# Patient Record
Sex: Male | Born: 2000 | Race: White | Hispanic: No | Marital: Single | Smoking: Current every day smoker
Health system: Southern US, Community
[De-identification: ages and names within clinical notes are randomized; demographics above are authoritative.]

## PROBLEM LIST (undated history)

## (undated) DIAGNOSIS — B001 Herpesviral vesicular dermatitis: Secondary | ICD-10-CM

## (undated) DIAGNOSIS — S060X0A Concussion without loss of consciousness, initial encounter: Secondary | ICD-10-CM

## (undated) HISTORY — DX: Herpesviral vesicular dermatitis: B00.1

## (undated) HISTORY — DX: Concussion without loss of consciousness, initial encounter: S06.0X0A

## (undated) HISTORY — PX: TYMPANOSTOMY TUBE PLACEMENT: SHX32

---
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
## 2018-08-02 ENCOUNTER — Encounter: Payer: Self-pay | Admitting: Physician Assistant

## 2018-08-02 ENCOUNTER — Ambulatory Visit: Payer: PRIVATE HEALTH INSURANCE | Admitting: Physician Assistant

## 2018-08-02 VITALS — BP 130/66 | HR 69 | Temp 98.0°F | Wt 181.0 lb

## 2018-08-02 DIAGNOSIS — B001 Herpesviral vesicular dermatitis: Secondary | ICD-10-CM | POA: Diagnosis not present

## 2018-08-02 DIAGNOSIS — J0140 Acute pansinusitis, unspecified: Secondary | ICD-10-CM

## 2018-08-02 MED ORDER — FLUTICASONE PROPIONATE 50 MCG/ACT NA SUSP: 2.0000 | Freq: Every day | NASAL | 6 refills | Status: DC

## 2018-08-02 MED ORDER — AZITHROMYCIN 250 MG PO TABS: ORAL_TABLET | ORAL | 0 refills | Status: DC

## 2018-08-02 MED ORDER — VALACYCLOVIR HCL 1 G PO TABS: ORAL_TABLET | ORAL | 3 refills | Status: AC

## 2018-08-02 NOTE — Patient Instructions (Signed)

## 2018-08-02 NOTE — Progress Notes (Signed)
   Subjective:    Patient ID: Antonio Garcia, male    DOB: 2000-09-23, 18 y.o.   MRN: [id number]  HPI  Patient is a 18 year old male who presents to the clinic with 2 weeks of sinus pressure, ear pain, cough, URI symptoms.  He denies any shortness of breath or wheezing.  In the last day he has had some right thigh redness, irritation, discomfort and discharge.  He has been taking DayQuil and NyQuil regularly.  It helps some but he cannot seem to get better.  He feels like his hearing has decreased over time.  He had a fever at the beginning but not had one since.  He denies any body aches or chills.  .. Active Ambulatory Problems    Diagnosis Date Noted  . ACUTE LYMPHADENITIS 02/04/2011  . Anxiety state 11/15/2015  . Obsessional thoughts 11/15/2015  . Concussion 07/25/2016   Resolved Ambulatory Problems    Diagnosis Date Noted  . Maxillary sinusitis, acute 09/14/2015   Past Medical History:  Diagnosis Date  . Concussion without loss of consciousness 06/2016  . Recurrent herpes labialis      Review of Systems See HPI.     Objective:   Physical Exam Vitals signs reviewed.  Constitutional:      Appearance: Normal appearance.  HENT:     Head: Normocephalic and atraumatic.     Comments: Tenderness over maxillary sinuses.     Ears:     Comments: TM's with bilateral middle ear effusions.     Nose: Congestion present.     Mouth/Throat:     Pharynx: Posterior oropharyngeal erythema present.  Eyes:     Extraocular Movements: Extraocular movements intact.     Pupils: Pupils are equal, round, and reactive to light.     Comments: Right injected conjunctiva without discharge.   Cardiovascular:     Rate and Rhythm: Normal rate and regular rhythm.  Pulmonary:     Effort: Pulmonary effort is normal.     Breath sounds: Normal breath sounds.  Neurological:     General: No focal deficit present.     Mental Status: He is alert and oriented to person, place, and time.  Psychiatric:         Mood and Affect: Mood normal.           Assessment & Plan:  Marland KitchenMarland KitchenMaximilien was seen today for cough.  Diagnoses and all orders for this visit:  Acute non-recurrent pansinusitis -     azithromycin (ZITHROMAX) 250 MG tablet; Take 2 tablets now and then one tablet for 4 days. -     fluticasone (FLONASE) 50 MCG/ACT nasal spray; Place 2 sprays into both nostrils daily.  Fever blister -     valACYclovir (VALTREX) 1000 MG tablet; TAKE 2 TABLETS BY MOUTH EVERY 12 HOURS FOR 2 DOSES AS NEEDED FOR FEVER BLISTERS   Pt was treated for sinusitis with zpak and flonase. Valtrex for fever blisters. HO given for symptomatic care. Follow up as needed.

## 2018-09-30 ENCOUNTER — Telehealth (INDEPENDENT_AMBULATORY_CARE_PROVIDER_SITE_OTHER): Payer: PRIVATE HEALTH INSURANCE | Admitting: Physician Assistant

## 2018-09-30 ENCOUNTER — Encounter: Payer: Self-pay | Admitting: Physician Assistant

## 2018-09-30 ENCOUNTER — Other Ambulatory Visit: Payer: Self-pay

## 2018-09-30 DIAGNOSIS — J029 Acute pharyngitis, unspecified: Secondary | ICD-10-CM | POA: Diagnosis not present

## 2018-09-30 MED ORDER — PENICILLIN V POTASSIUM 500 MG PO TABS: 500.0000 mg | ORAL_TABLET | Freq: Two times a day (BID) | ORAL | 0 refills | Status: DC

## 2018-09-30 NOTE — Patient Instructions (Signed)
Pharyngitis  Pharyngitis is a sore throat (pharynx). This is when there is redness, pain, and swelling in your throat. Most of the time, this condition gets better on its own. In some cases, you may need medicine. Follow these instructions at home:  Take over-the-counter and prescription medicines only as told by your doctor. ? If you were prescribed an antibiotic medicine, take it as told by your doctor. Do not stop taking the antibiotic even if you start to feel better. ? Do not give children aspirin. Aspirin has been linked to Reye syndrome.  Drink enough water and fluids to keep your pee (urine) clear or pale yellow.  Get a lot of rest.  Rinse your mouth (gargle) with a salt-water mixture 3-4 times a day or as needed. To make a salt-water mixture, completely dissolve -1 tsp of salt in 1 cup of warm water.  If your doctor approves, you may use throat lozenges or sprays to soothe your throat. Contact a doctor if:  You have large, tender lumps in your neck.  You have a rash.  You cough up green, yellow-brown, or bloody spit. Get help right away if:  You have a stiff neck.  You drool or cannot swallow liquids.  You cannot drink or take medicines without throwing up.  You have very bad pain that does not go away with medicine.  You have problems breathing, and it is not from a stuffy nose.  You have new pain and swelling in your knees, ankles, wrists, or elbows. Summary  Pharyngitis is a sore throat (pharynx). This is when there is redness, pain, and swelling in your throat.  If you were prescribed an antibiotic medicine, take it as told by your doctor. Do not stop taking the antibiotic even if you start to feel better.  Most of the time, pharyngitis gets better on its own. Sometimes, you may need medicine. This information is not intended to replace advice given to you by your health care provider. Make sure you discuss any questions you have with your health care  provider. Document Released: 11/29/2007 Document Revised: 07/18/2016 Document Reviewed: 07/18/2016 Elsevier Interactive Patient Education  2019 [employer].  

## 2018-09-30 NOTE — Progress Notes (Signed)
Virtual Visit via Telephone Note  I connected with Antonio Garcia on 09/30/18 at  2:40 PM EDT by telephone and verified that I am speaking with the correct person using two identifiers.   I discussed the limitations, risks, security and privacy concerns of performing an evaluation and management service by telephone and the availability of in person appointments. I also discussed with the patient that there may be a patient responsible charge related to this service. The patient expressed understanding and agreed to proceed.   History of Present Illness: Reports fever for 4 days (since Friday). Highest fever of 103 yesterday. Associated with myalgias and sore throat.  Denies swelling or exudates on his tonsils, but admits he hasn't really looked in his throat. Endorses tender cervical lymph nodes worse on the right side Denies dysphagia, drooling, voice change Endorses intermittent cough occasionally productive of yellow mucus. Denies chest tightness or shortness of breath Denies any known sick contacts.  Non-smoker  No prior hx of asthma or lung disease   Past Medical History:  Diagnosis Date  . Concussion without loss of consciousness 06/2016  . Recurrent herpes labialis    Past Surgical History:  Procedure Laterality Date  . TYMPANOSTOMY TUBE PLACEMENT Bilateral    Social History   Tobacco Use  . Smoking status: Never Smoker  . Smokeless tobacco: Never Used  Substance Use Topics  . Alcohol use: No   family history includes Anxiety disorder in his father and sister; Colon cancer in an other family member; Heart attack in an other family member; Hyperlipidemia in his father, maternal grandfather, and mother.    ROS: negative except as noted in the HPI  Medications: Current Outpatient Medications  Medication Sig Dispense Refill  . valACYclovir (VALTREX) 1000 MG tablet TAKE 2 TABLETS BY MOUTH EVERY 12 HOURS FOR 2 DOSES AS NEEDED FOR FEVER BLISTERS 20 tablet 3   No  current facility-administered medications for this visit.    No Known Allergies     Objective:  There were no vitals taken for this visit. Pulm: Normal work of breathing, normal phonation, speaking in full sentences   No results found for this or any previous visit (from the past 72 hour(s)). No results found.    Assessment and Plan: 18 y.o. male with   .Diagnoses and all orders for this visit:  Acute pharyngitis, unspecified etiology -     penicillin v potassium (VEETID) 500 MG tablet; Take 1 tablet (500 mg total) by mouth 2 (two) times daily.   Centor score =2, 11-18% chance of GAS Treating empirically for GAS with PCN. Reassess in 48 hours We discussed alternative diagnoses including viral pharyngitis, infectious mono, and COVID-19 Recommended he take precautions and assume he is contagious. Isolate in room, avoid direct contact with household members, wash hands frequently and disinfect shared surfaces  Follow Up Instructions:    I discussed the assessment and treatment plan with the patient. The patient was provided an opportunity to ask questions and all were answered. The patient agreed with the plan and demonstrated an understanding of the instructions.   The patient was advised to call back or seek an in-person evaluation if the symptoms worsen or if the condition fails to improve as anticipated.  I provided 11-20 minutes of non-face-to-face time during this encounter.   Carlis Stable, [place]

## 2018-10-02 ENCOUNTER — Ambulatory Visit (INDEPENDENT_AMBULATORY_CARE_PROVIDER_SITE_OTHER): Payer: PRIVATE HEALTH INSURANCE | Admitting: Physician Assistant

## 2018-10-02 ENCOUNTER — Encounter: Payer: Self-pay | Admitting: Physician Assistant

## 2018-10-02 VITALS — Temp 98.0°F

## 2018-10-02 DIAGNOSIS — J029 Acute pharyngitis, unspecified: Secondary | ICD-10-CM

## 2018-10-02 NOTE — Progress Notes (Signed)
Virtual Visit via Telephone Note  I connected with Antonio Garcia on 10/02/18 at  1:20 PM EDT by telephone and verified that I am speaking with the correct person using two identifiers.   I discussed the limitations, risks, security and privacy concerns of performing an evaluation and management service by telephone and the availability of in person appointments. I also discussed with the patient that there may be a patient responsible charge related to this service. The patient expressed understanding and agreed to proceed.   History of Present Illness: 2 day follow-up for pharyngitis Antonio Garcia presented for an e-visit on Monday 09/30/18 with 4 days of fever and sore throat. He was treated empirically for GAS pharyngitis with PCN. He is on day 3 of antibiotics currently. Has not had a fever since Monday, sore throat has improved, but is still present. Has noticed white patches on both tonsils and still has cervical adenopathy. Reports he is having headaches that improve with OTC analgesics. Denies cough or SOB.    Past Medical History:  Diagnosis Date  . Concussion without loss of consciousness 06/2016  . Recurrent herpes labialis    Past Surgical History:  Procedure Laterality Date  . TYMPANOSTOMY TUBE PLACEMENT Bilateral    Social History   Tobacco Use  . Smoking status: Never Smoker  . Smokeless tobacco: Never Used  Substance Use Topics  . Alcohol use: No   family history includes Anxiety disorder in his father and sister; Colon cancer in an other family member; Heart attack in an other family member; Hyperlipidemia in his father, maternal grandfather, and mother.    ROS: negative except as noted in the HPI  Medications: Current Outpatient Medications  Medication Sig Dispense Refill  . penicillin v potassium (VEETID) 500 MG tablet Take 1 tablet (500 mg total) by mouth 2 (two) times daily. 20 tablet 0  . valACYclovir (VALTREX) 1000 MG tablet TAKE 2 TABLETS BY MOUTH EVERY 12  HOURS FOR 2 DOSES AS NEEDED FOR FEVER BLISTERS 20 tablet 3   No current facility-administered medications for this visit.    No Known Allergies     Objective:  Temp 98 F (36.7 C) (Oral)  Pulm: Normal work of breathing, normal phonation, speaking in full sentences Neuro: alert and oriented x 3   No results found for this or any previous visit (from the past 72 hour(s)). No results found.    Assessment and Plan: 18 y.o. male with   .Diagnoses and all orders for this visit:  Acute pharyngitis, unspecified etiology  Clinically improving with antibiotic therapy. Viral pharyngitis is still on the differential, including infectious mono Complete full 7 day course of antibiotic Return precautions discussed Work note provided for remainder of the week  Follow Up Instructions:    I discussed the assessment and treatment plan with the patient. The patient was provided an opportunity to ask questions and all were answered. The patient agreed with the plan and demonstrated an understanding of the instructions.   The patient was advised to call back or seek an in-person evaluation if the symptoms worsen or if the condition fails to improve as anticipated.  I provided 5-10 minutes of non-face-to-face time during this encounter.   Carlis Stable, [place]

## 2018-10-02 NOTE — Patient Instructions (Signed)

## 2018-12-18 ENCOUNTER — Encounter: Payer: Self-pay | Admitting: Family Medicine

## 2018-12-18 ENCOUNTER — Telehealth: Payer: Self-pay

## 2018-12-18 ENCOUNTER — Other Ambulatory Visit: Payer: Self-pay

## 2018-12-18 ENCOUNTER — Ambulatory Visit (INDEPENDENT_AMBULATORY_CARE_PROVIDER_SITE_OTHER): Payer: PRIVATE HEALTH INSURANCE | Admitting: Family Medicine

## 2018-12-18 DIAGNOSIS — G43809 Other migraine, not intractable, without status migrainosus: Secondary | ICD-10-CM

## 2018-12-18 NOTE — Telephone Encounter (Signed)
Opened in error

## 2018-12-18 NOTE — Progress Notes (Signed)
Pt did not obtain any vitals.

## 2018-12-18 NOTE — Patient Instructions (Signed)
Thank you for coming in today.    

## 2018-12-18 NOTE — Progress Notes (Signed)
Virtual Visit  via Video Note  I connected with      Antonio Garcia Demetrius by a video enabled telemedicine application and verified that I am speaking with the correct person using two identifiers.   I discussed the limitations of evaluation and management by telemedicine and the availability of in person appointments. The patient expressed understanding and agreed to proceed.  History of Present Illness: Antonio Garcia Bilotti is a 18 y.o. male who would like to discuss migraine. Monday awoke with migraine and missed work. Feeling better now after taking Tylenol Monday and yesterday.  He feels as though he is ready to return to work but needs a return to work note.  He notes a get migraines a few times a year.  He gets less than 2 migraines per month.  He feels well otherwise with no fevers chills nausea vomiting or diarrhea.  No cough body aches headache.   Observations/Objective: There were no vitals taken for this visit. Wt Readings from Last 5 Encounters:  08/02/18 181 lb (82.1 kg) (81 %, Z= 0.88)*  12/19/16 169 lb 9.6 oz (76.9 kg) (76 %, Z= 0.72)*  12/15/16 167 lb (75.8 kg) (74 %, Z= 0.64)*  11/02/16 169 lb 4 oz (76.8 kg) (77 %, Z= 0.73)*  08/08/16 168 lb (76.2 kg) (77 %, Z= 0.73)*   * Growth percentiles are based on CDC (Boys, 2-20 Years) data.   Exam: Appearance nontoxic no acute distress Normal Speech.  Neuro: Moves all extremities.  Normal coordination.  Normal facial motion.  Lab and Radiology Results No results found for this or any previous visit (from the past 72 hour(s)). No results found.   Assessment and Plan: 18 y.o. male with migraine: Much improved now.  Discussed prevention prophylactic medication.  Patient does not meet criteria for prophylactic treatment at this time.  Additionally discussed abortive medications such as Imitrex.  Patient declines. Okay to return to work.  Note provided.  Recheck with me in near future for physical.   PDMP not reviewed this  encounter. No orders of the defined types were placed in this encounter.  No orders of the defined types were placed in this encounter.   Follow Up Instructions:    I discussed the assessment and treatment plan with the patient. The patient was provided an opportunity to ask questions and all were answered. The patient agreed with the plan and demonstrated an understanding of the instructions.   The patient was advised to call back or seek an in-person evaluation if the symptoms worsen or if the condition fails to improve as anticipated.  Time: 15 minutes of intraservice time, with >22 minutes of total time during today's visit.      Historical information moved to improve visibility of documentation.  Past Medical History:  Diagnosis Date  . Concussion without loss of consciousness 06/2016  . Recurrent herpes labialis    Past Surgical History:  Procedure Laterality Date  . TYMPANOSTOMY TUBE PLACEMENT Bilateral    Social History   Tobacco Use  . Smoking status: Never Smoker  . Smokeless tobacco: Never Used  Substance Use Topics  . Alcohol use: No   family history includes Anxiety disorder in his father and sister; Colon cancer in an other family member; Heart attack in an other family member; Hyperlipidemia in his father, maternal grandfather, and mother.  Medications: Current Outpatient Medications  Medication Sig Dispense Refill  . valACYclovir (VALTREX) 1000 MG tablet TAKE 2 TABLETS BY MOUTH EVERY 12 HOURS FOR  2 DOSES AS NEEDED FOR FEVER BLISTERS 20 tablet 3   No current facility-administered medications for this visit.    No Known Allergies

## 2019-03-20 ENCOUNTER — Other Ambulatory Visit: Payer: Self-pay

## 2019-03-20 ENCOUNTER — Encounter: Payer: Self-pay | Admitting: Family Medicine

## 2019-03-20 ENCOUNTER — Ambulatory Visit (INDEPENDENT_AMBULATORY_CARE_PROVIDER_SITE_OTHER): Payer: PRIVATE HEALTH INSURANCE | Admitting: Family Medicine

## 2019-03-20 ENCOUNTER — Ambulatory Visit (INDEPENDENT_AMBULATORY_CARE_PROVIDER_SITE_OTHER): Payer: PRIVATE HEALTH INSURANCE

## 2019-03-20 ENCOUNTER — Other Ambulatory Visit: Payer: Self-pay | Admitting: Family Medicine

## 2019-03-20 VITALS — BP 130/79 | HR 83 | Temp 98.2°F | Wt 177.0 lb

## 2019-03-20 DIAGNOSIS — R0789 Other chest pain: Secondary | ICD-10-CM | POA: Diagnosis not present

## 2019-03-20 DIAGNOSIS — K5900 Constipation, unspecified: Secondary | ICD-10-CM

## 2019-03-20 DIAGNOSIS — R1013 Epigastric pain: Secondary | ICD-10-CM

## 2019-03-20 MED ORDER — SUCRALFATE 1 G PO TABS: 1.0000 g | ORAL_TABLET | Freq: Four times a day (QID) | ORAL | 0 refills | Status: DC

## 2019-03-20 MED ORDER — POLYETHYLENE GLYCOL 3350 17 GM/SCOOP PO POWD: 17.0000 g | Freq: Every day | ORAL | 1 refills | Status: DC

## 2019-03-20 MED ORDER — OMEPRAZOLE 40 MG PO CPDR: 40.0000 mg | DELAYED_RELEASE_CAPSULE | Freq: Every day | ORAL | 3 refills | Status: DC

## 2019-03-20 NOTE — Patient Instructions (Addendum)
Thank you for coming in today. Get x-ray and labs today. Take MiraLAX 1 capful 1-2 times per day to achieve soft bowel movement daily.  Reduce dose if needed. Take omeprazole daily for 1 month to reduce stomach acid. Take Carafate 4 times daily for about 1 week to coat and lie in the stomach. You should hear soon about stress test.  Let me know in 1 week if you have not heard anything about scheduling. You should also hear soon about echocardiogram.  Let me know if you do not hear anything.  Recheck in 1 month.   Nonspecific Chest Pain Chest pain can be caused by many different conditions. Some causes of chest pain can be life-threatening. These will require treatment right away. Serious causes of chest pain include:  Heart attack.  A tear in the body's main blood vessel.  Redness and swelling (inflammation) around your heart.  Blood clot in your lungs. Other causes of chest pain may not be so serious. These include:  Heartburn.  Anxiety or stress.  Damage to bones or muscles in your chest.  Lung infections. Chest pain can feel like:  Pain or discomfort in your chest.  Crushing, pressure, aching, or squeezing pain.  Burning or tingling.  Dull or sharp pain that is worse when you move, cough, or take a deep breath.  Pain or discomfort that is also felt in your back, neck, jaw, shoulder, or arm, or pain that spreads to any of these areas. It is hard to know whether your pain is caused by something that is serious or something that is not so serious. So it is important to see your doctor right away if you have chest pain. Follow these instructions at home: Medicines  Take over-the-counter and prescription medicines only as told by your doctor.  If you were prescribed an antibiotic medicine, take it as told by your doctor. Do not stop taking the antibiotic even if you start to feel better. Lifestyle   Rest as told by your doctor.  Do not use any products that contain  nicotine or tobacco, such as cigarettes, e-cigarettes, and chewing tobacco. If you need help quitting, ask your doctor.  Do not drink alcohol.  Make lifestyle changes as told by your doctor. These may include: ? Getting regular exercise. Ask your doctor what activities are safe for you. ? Eating a heart-healthy diet. A diet and nutrition specialist (dietitian) can help you to learn healthy eating options. ? Staying at a healthy weight. ? Treating diabetes or high blood pressure, if needed. ? Lowering your stress. Activities such as yoga and relaxation techniques can help. General instructions  Pay attention to any changes in your symptoms. Tell your doctor about them or any new symptoms.  Avoid any activities that cause chest pain.  Keep all follow-up visits as told by your doctor. This is important. You may need more testing if your chest pain does not go away. Contact a doctor if:  Your chest pain does not go away.  You feel depressed.  You have a fever. Get help right away if:  Your chest pain is worse.  You have a cough that gets worse, or you cough up blood.  You have very bad (severe) pain in your belly (abdomen).  You pass out (faint).  You have either of these for no clear reason: ? Sudden chest discomfort. ? Sudden discomfort in your arms, back, neck, or jaw.  You have shortness of breath at any time.  You suddenly start to sweat, or your skin gets clammy.  You feel sick to your stomach (nauseous).  You throw up (vomit).  You suddenly feel lightheaded or dizzy.  You feel very weak or tired.  Your heart starts to beat fast, or it feels like it is skipping beats. These symptoms may be an emergency. Do not wait to see if the symptoms will go away. Get medical help right away. Call your local emergency services (911 in the [place] Do not drive yourself to the hospital. Summary  Chest pain can be caused by many different conditions. The cause may be serious  and need treatment right away. If you have chest pain, see your doctor right away.  Follow your doctor's instructions for taking medicines and making lifestyle changes.  Keep all follow-up visits as told by your doctor. This includes visits for any further testing if your chest pain does not go away.  Be sure to know the signs that show that your condition has become worse. Get help right away if you have these symptoms. This information is not intended to replace advice given to you by your health care provider. Make sure you discuss any questions you have with your health care provider. Document Released: 11/29/2007 Document Revised: 12/13/2017 Document Reviewed: 12/13/2017 Elsevier Patient Education  2020 [employer].

## 2019-03-20 NOTE — Progress Notes (Signed)
Antonio DickerBrendan Mervine is a 18 y.o. male who [facility] today for ongoing chest pain, heartburn, and constipation.    Chest pain  - Patient was seen in ED on 01/30/19 for sudden onset sharp chest pain. ED evaluation concluded that pain was likely due to GERD. Patient continues to have dull-like pain in chest that comes and goes throughout the day. The pain has not resolved since presenting to the ED. The pain sometimes wakes him up in the middle of the night. He sometimes has episodes where it feels like his heart is racing.  The pain used to be tender in his chest and between his shoulder blades but the tenderness has resolved. He has tried ibuprofen, IcyHot, icing the area, tennis ball rolling with no relief. He is constantly worrying about the pain and was not satisfied with ED's evaluation. He denies SOB, pain on inspiration, fever, chills, cough, recent injury or trauma.   Heartburn/Constipation - Patient took 6-8 ibuprofen at once on Saturday to relieve his chest pain. Soon after, he began to have heartburn. He took a "acid reducer" a couple of times but the heartburn has been persistent.  - Since Saturday after he took the ibuprofen, patient has been constipated. He took Miralax on Monday and has been eating food with fiber and fruits, avoiding soda, and drinking water. This provided some relief and he was able to have a few small BM but reports straining. He also reports persistent diffuse mild pain in his epigastric region and difficulty passing gas.   ROS as above  Exam:  BP 130/79   Pulse 83   Temp 98.2 F (36.8 C) (Oral)   Wt 177 lb (80.3 kg)   BMI 20.45 kg/m  Wt Readings from Last 5 Encounters:  03/20/19 177 lb (80.3 kg)  08/02/18 181 lb (82.1 kg) (81 %, Z= 0.88)*  12/19/16 169 lb 9.6 oz (76.9 kg) (76 %, Z= 0.72)*  12/15/16 167 lb (75.8 kg) (74 %, Z=  0.64)*  11/02/16 169 lb 4 oz (76.8 kg) (77 %, Z= 0.73)*   * Growth percentiles are based on CDC (Boys, 2-20 Years) data.    Gen: Well NAD. Non-toxic appearing.  Lungs: Normal work of breathing. CTABL. No wheezes, rales, or rhonchi. Heart: RRR no MRG.  Abd: NABS, Soft. Nondistended, Nontender.  Exts: Brisk capillary refill, warm and well perfused.  MSK: No TTP of chest wall or paraspinal region.   Lab and Radiology Results  12-Lead EKG Normal sinus rhythm with a rate of 74 bpm.  No ST segment elevation or depression.  Possible left axis deviation.  T waves slightly peaked but not diagnostic for typical peaked T waves..  - PR interval - 160 ms  - QRS duration: 106 ms - QT/QTc - 356/413  Assessment and Plan: 18 y.o. male with a two month history of chest pain and 1 week history of heartburn and constipation. EKG showed evidence of peaked T waves and possible left ventricular hypertrophy. Will further evaluate cardiac function with echocardiogram and stress test. Will order CXR, lipase troponin, CMP, TSH to further evaluate chest pain. Will also perform H.pylori breath test to evaluate epigastric pain and start omeprazole and Carafate for management of heartburn. Will start Miralax daily for constipation.   PDMP not reviewed this encounter. Orders Placed This Encounter  Procedures  . DG Abd Acute W/Chest    Standing Status:   Future  Number of Occurrences:   1    Standing Expiration Date:   05/19/2020    Order Specific Question:   Reason for Exam (SYMPTOM  OR DIAGNOSIS REQUIRED)    Answer:   eval left chest and left upper quadrent abd    Order Specific Question:   Preferred imaging location?    Answer:   [facility]    Order Specific Question:   Radiology Contrast Protocol - do NOT remove file path    Answer:   [email or web address]  . Troponin I  . Lipase  . COMPLETE METABOLIC PANEL WITH GFR  . TSH  . H. pylori breath test  .  EXERCISE TOLERANCE TEST (ETT)    Standing Status:   Future    Standing Expiration Date:   06/18/2020    Order Specific Question:   Where should this test be performed    Answer:   [facility]    Order Specific Question:   Stress with Dobutamine or Treadmill with exercise?    Answer:   Treadmill w/ exercise    Order Specific Question:   Is patient able to ambulate on a treadmill?    Answer:   Yes  . EKG 12-Lead  . ECHOCARDIOGRAM COMPLETE    Standing Status:   Future    Standing Expiration Date:   06/18/2020    Order Specific Question:   Where should this test be performed    Answer:   [facility]    Order Specific Question:   Perflutren DEFINITY (image enhancing agent) should be administered unless hypersensitivity or allergy exist    Answer:   Administer Perflutren    Order Specific Question:   Reason for exam-Echo    Answer:   Chest Pain  786.50 / R07.9   Meds ordered this encounter  Medications  . omeprazole (PRILOSEC) 40 MG capsule    Sig: Take 1 capsule (40 mg total) by mouth daily.    Dispense:  30 capsule    Refill:  3  . sucralfate (CARAFATE) 1 g tablet    Sig: Take 1 tablet (1 g total) by mouth 4 (four) times daily.    Dispense:  120 tablet    Refill:  0  . polyethylene glycol powder (GLYCOLAX/MIRALAX) 17 GM/SCOOP powder    Sig: Take 17 g by mouth daily.    Dispense:  850 g    Refill:  1     Historical information moved to improve visibility of documentation.  Past Medical History:  Diagnosis Date  . Concussion without loss of consciousness 06/2016  . Recurrent herpes labialis    Past Surgical History:  Procedure Laterality Date  . TYMPANOSTOMY TUBE PLACEMENT Bilateral    Social History   Tobacco Use  . Smoking status: Never Smoker  . Smokeless tobacco: Never Used  Substance Use Topics  . Alcohol use: No   family history includes Anxiety disorder in his father and sister; Colon cancer in an other family member; Heart  attack in an other family member; Hyperlipidemia in his father, maternal grandfather, and mother.  Medications: Current Outpatient Medications  Medication Sig Dispense Refill  . omeprazole (PRILOSEC) 40 MG capsule Take 1 capsule (40 mg total) by mouth daily. 30 capsule 3  . polyethylene glycol powder (GLYCOLAX/MIRALAX) 17 GM/SCOOP powder Take 17 g by mouth daily. 850 g 1  . sucralfate (CARAFATE) 1 g tablet Take 1 tablet (1 g total) by mouth 4 (four) times daily. 120 tablet 0  . valACYclovir (  VALTREX) 1000 MG tablet TAKE 2 TABLETS BY MOUTH EVERY 12 HOURS FOR 2 DOSES AS NEEDED FOR FEVER BLISTERS 20 tablet 3   No current facility-administered medications for this visit.    No Known Allergies   Discussed warning signs or symptoms. Please see discharge instructions. Patient expresses understanding.   I personally was present and performed or re-performed the history, physical exam and medical decision-making activities of this service and have verified that the service and findings are accurately documented in the student's note. ___________________________________________ Lynne Leader M.D., ABFM., CAQSM. Primary Care and Sports Medicine Adjunct Instructor of [employer]

## 2019-03-21 ENCOUNTER — Telehealth: Payer: Self-pay | Admitting: Family Medicine

## 2019-03-21 DIAGNOSIS — R161 Splenomegaly, not elsewhere classified: Secondary | ICD-10-CM

## 2019-03-21 LAB — COMPLETE METABOLIC PANEL WITH GFR
AG Ratio: 1.9 (calc) (ref 1.0–2.5)
ALT: 13 U/L (ref 9–46)
AST: 17 U/L (ref 10–40)
Albumin: 4.3 g/dL (ref 3.6–5.1)
Alkaline phosphatase (APISO): 58 U/L (ref 36–130)
BUN: 14 mg/dL (ref 7–25)
CO2: 27 mmol/L (ref 20–32)
Calcium: 9.7 mg/dL (ref 8.6–10.3)
Chloride: 104 mmol/L (ref 98–110)
Creat: 1.04 mg/dL (ref 0.60–1.35)
GFR, Est African American: 119 mL/min/{1.73_m2} (ref >=60)
GFR, Est Non African American: 103 mL/min/{1.73_m2} (ref >=60)
Globulin: 2.3 g/dL (calc) (ref 1.9–3.7)
Glucose, Bld: 81 mg/dL (ref 65–99)
Potassium: 4.6 mmol/L (ref 3.5–5.3)
Sodium: 139 mmol/L (ref 135–146)
Total Bilirubin: 0.7 mg/dL (ref 0.2–1.2)
Total Protein: 6.6 g/dL (ref 6.1–8.1)

## 2019-03-21 LAB — H. PYLORI BREATH TEST: H. pylori Breath Test: NOT DETECTED

## 2019-03-21 LAB — LIPASE: Lipase: 25 U/L (ref 7–60)

## 2019-03-21 LAB — TSH: TSH: 0.78 mIU/L (ref 0.40–4.50)

## 2019-03-21 LAB — TROPONIN I: Troponin I: <0.01 ng/mL (ref <=0.0)

## 2019-03-21 NOTE — Telephone Encounter (Signed)
Chest and abdomen x-ray shows some concern for enlarged spleen.  We will evaluate this with ultrasound.  I have ordered an abdominal ultrasound which should be done in the near future.  You should hear from radiology department soon for scheduling.  You can always call them yourself at [phone]

## 2019-03-24 ENCOUNTER — Ambulatory Visit (HOSPITAL_COMMUNITY): Payer: PRIVATE HEALTH INSURANCE | Attending: Internal Medicine

## 2019-03-24 ENCOUNTER — Ambulatory Visit (INDEPENDENT_AMBULATORY_CARE_PROVIDER_SITE_OTHER): Payer: PRIVATE HEALTH INSURANCE

## 2019-03-24 ENCOUNTER — Other Ambulatory Visit: Payer: Self-pay

## 2019-03-24 DIAGNOSIS — R0789 Other chest pain: Secondary | ICD-10-CM

## 2019-03-24 DIAGNOSIS — R161 Splenomegaly, not elsewhere classified: Secondary | ICD-10-CM

## 2019-03-25 ENCOUNTER — Telehealth: Payer: Self-pay | Admitting: Family Medicine

## 2019-03-25 DIAGNOSIS — D649 Anemia, unspecified: Secondary | ICD-10-CM

## 2019-03-25 DIAGNOSIS — Z114 Encounter for screening for human immunodeficiency virus [HIV]: Secondary | ICD-10-CM

## 2019-03-25 DIAGNOSIS — R161 Splenomegaly, not elsewhere classified: Secondary | ICD-10-CM

## 2019-03-25 NOTE — Telephone Encounter (Signed)
Based on enlarged spleen on ultrasound and absence of other abnormal tests will proceed with further lab tests to figure this out further.   Get blood labs soon.

## 2019-03-25 NOTE — Telephone Encounter (Signed)
Pt advised.

## 2019-03-28 LAB — CMV ABS, IGG+IGM (CYTOMEGALOVIRUS)
CMV IgM: <30 AU/mL
Cytomegalovirus Ab-IgG: <0.6 U/mL

## 2019-03-28 LAB — CBC WITH DIFFERENTIAL/PLATELET
Absolute Monocytes: 344 cells/uL (ref 200–950)
Basophils Absolute: 30 cells/uL (ref 0–200)
Basophils Relative: 0.7 %
Eosinophils Absolute: 69 cells/uL (ref 15–500)
Eosinophils Relative: 1.6 %
HCT: 43.9 % (ref 38.5–50.0)
Hemoglobin: 15.1 g/dL (ref 13.2–17.1)
Lymphs Abs: 1432 cells/uL (ref 850–3900)
MCH: 30.1 pg (ref 27.0–33.0)
MCHC: 34.4 g/dL (ref 32.0–36.0)
MCV: 87.5 fL (ref 80.0–100.0)
MPV: 13.1 fL — ABNORMAL HIGH (ref 7.5–12.5)
Monocytes Relative: 8 %
Neutro Abs: 2425 cells/uL (ref 1500–7800)
Neutrophils Relative %: 56.4 %
Platelets: 181 10*3/uL (ref 140–400)
RBC: 5.02 10*6/uL (ref 4.20–5.80)
RDW: 12.6 % (ref 11.0–15.0)
Total Lymphocyte: 33.3 %
WBC: 4.3 10*3/uL (ref 3.8–10.8)

## 2019-03-28 LAB — EPSTEIN-BARR VIRUS VCA ANTIBODY PANEL
EBV NA IgG: 259 U/mL — ABNORMAL HIGH
EBV VCA IgG: 174 U/mL — ABNORMAL HIGH
EBV VCA IgM: <36 U/mL

## 2019-03-28 LAB — IRON,TIBC AND FERRITIN PANEL
%SAT: 41 % (calc) (ref 20–48)
Ferritin: 114 ng/mL (ref 38–380)
Iron: 119 ug/dL (ref 50–195)
TIBC: 288 mcg/dL (calc) (ref 250–425)

## 2019-03-28 LAB — HIV ANTIBODY (ROUTINE TESTING W REFLEX): HIV 1&2 Ab, 4th Generation: NONREACTIVE

## 2019-03-28 LAB — PATHOLOGIST SMEAR REVIEW

## 2019-04-01 ENCOUNTER — Inpatient Hospital Stay (HOSPITAL_COMMUNITY)
Admission: RE | Admit: 2019-04-01 | Discharge: 2019-04-01 | Disposition: A | Discharge status: Home / Self Care | Payer: PRIVATE HEALTH INSURANCE | Source: Ambulatory Visit

## 2019-04-01 NOTE — Progress Notes (Signed)
Contacted pt regarding his covid test. Pt states that he will not be coming today and plans to cancel his upcoming cardiac test. Pt advised to call his cardiologist office to do so. Pt verbalized agreement.   Jacqlyn Larsen, RN

## 2019-04-04 ENCOUNTER — Ambulatory Visit (HOSPITAL_COMMUNITY)
Admission: RE | Admit: 2019-04-04 | Payer: PRIVATE HEALTH INSURANCE | Source: Ambulatory Visit | Attending: Family Medicine | Admitting: Family Medicine

## 2019-08-25 ENCOUNTER — Ambulatory Visit: Payer: PRIVATE HEALTH INSURANCE | Attending: Internal Medicine

## 2019-08-25 DIAGNOSIS — Z20822 Contact with and (suspected) exposure to covid-19: Secondary | ICD-10-CM

## 2019-08-26 LAB — NOVEL CORONAVIRUS, NAA: SARS-CoV-2, NAA: NOT DETECTED

## 2019-12-15 ENCOUNTER — Other Ambulatory Visit: Payer: Self-pay

## 2019-12-15 ENCOUNTER — Encounter: Payer: Self-pay | Admitting: Family Medicine

## 2019-12-15 ENCOUNTER — Ambulatory Visit (INDEPENDENT_AMBULATORY_CARE_PROVIDER_SITE_OTHER): Payer: PRIVATE HEALTH INSURANCE

## 2019-12-15 ENCOUNTER — Ambulatory Visit (INDEPENDENT_AMBULATORY_CARE_PROVIDER_SITE_OTHER): Payer: Self-pay | Admitting: Family Medicine

## 2019-12-15 VITALS — BP 118/70 | HR 81 | Ht 78.0 in | Wt 180.0 lb

## 2019-12-15 DIAGNOSIS — S6721XA Crushing injury of right hand, initial encounter: Secondary | ICD-10-CM

## 2019-12-15 DIAGNOSIS — M79641 Pain in right hand: Secondary | ICD-10-CM

## 2019-12-15 MED ORDER — PREDNISONE 20 MG PO TABS: 40.0000 mg | ORAL_TABLET | Freq: Every day | ORAL | 0 refills | Status: DC

## 2019-12-15 NOTE — Progress Notes (Signed)
Acute Office Visit  Subjective:    Patient ID: Antonio Garcia, male    DOB: [birth year], 19 y.o.   MRN: [id number]  Chief Complaint  Patient presents with  . Hand Pain    HPI Patient is in today for accidentally dropped a weight on the palm of his hand on Thursday.  Says initially didn't feel the injury was too bad.  Then starting yesterday started to feel some numbness radiating up to his elbow.    Used icy hot.  Didn't try anything else.    Past Medical History:  Diagnosis Date  . Concussion without loss of consciousness 06/2016  . Recurrent herpes labialis     Past Surgical History:  Procedure Laterality Date  . TYMPANOSTOMY TUBE PLACEMENT Bilateral     Family History  Problem Relation Age of Onset  . Hyperlipidemia Mother   . Hyperlipidemia Father   . Anxiety disorder Father   . Hyperlipidemia Maternal Grandfather   . Colon cancer Other   . Heart attack Other   . Anxiety disorder Sister     Social History   Socioeconomic History  . Marital status: Single    Spouse name: Not on file  . Number of children: 0  . Years of education: 65  . 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 Smoking status: Never Smoker  . Smokeless tobacco: Never Used  Vaping Use  . Vaping Use: Never used  Substance and Sexual Activity  . Alcohol use: No  . Drug use: No  . Sexual activity: Not on file  Other Topics Concern  . Not on file  Social History Narrative   12/19/16 Lives with mom, dad.   Has two older sisters.   12th grade at [employer].   No extracurricular activities.   Social Determinants of Health   Financial Resource Strain:   . Difficulty of Paying Living Expenses:   Food Insecurity:   . Worried About Programme researcher, broadcasting/film/video in the Last Year:   . Barista in the Last Year:   Transportation Needs:   . Freight forwarder (Medical):   Marland Kitchen Lack of Transportation (Non-Medical):   Physical  Activity:   . Days of Exercise per Week:   . Minutes of Exercise per Session:   Stress:   . Feeling of Stress :   Social Connections:   . Frequency of Communication with Friends and Family:   . Frequency of Social Gatherings with Friends and Family:   . Attends Religious Services:   . Active Member of Clubs or Organizations:   . Attends Banker Meetings:   Marland Kitchen Marital Status:   Intimate Partner Violence:   . Fear of Current or Ex-Partner:   . Emotionally Abused:   Marland Kitchen Physically Abused:   . Sexually Abused:     Outpatient Medications Prior to Visit  Medication Sig Dispense Refill  . omeprazole (PRILOSEC) 40 MG capsule Take 1 capsule (40 mg total) by mouth daily. (Patient not taking: Reported on 12/15/2019) 30 capsule 3  . polyethylene glycol powder (GLYCOLAX/MIRALAX) 17 GM/SCOOP powder Take 17 g by mouth daily. (Patient not taking: Reported on 12/15/2019) 850 g 1  . sucralfate (CARAFATE) 1 g tablet Take 1 tablet (1 g total) by mouth 4 (four) times daily. (Patient not taking: Reported on 12/15/2019) 120 tablet 0  . valACYclovir (VALTREX) 1000 MG tablet TAKE 2 TABLETS BY  MOUTH EVERY 12 HOURS FOR 2 DOSES AS NEEDED FOR FEVER BLISTERS (Patient not taking: Reported on 12/15/2019) 20 tablet 3   No facility-administered medications prior to visit.    No Known Allergies  Review of Systems     Objective:    Physical Exam Vitals reviewed.  Constitutional:      Appearance: He is well-developed.  HENT:     Head: Normocephalic and atraumatic.  Eyes:     Conjunctiva/sclera: Conjunctivae normal.  Cardiovascular:     Rate and Rhythm: Normal rate.  Pulmonary:     Effort: Pulmonary effort is normal.  Musculoskeletal:     Comments: Right wrist with normal flexion.  Significant trouble with dorsiflexion but he was gradually able to move it with significant effort.  He denies any pain with motion.  He really does not have a lot of pain just some tenderness at the distal head of the  metacarpal bone of the middle finger.  He is able to pinch thumb and fingers to gather.  He has significant swelling on the dorsum of the right hand.  Skin:    General: Skin is dry.     Coloration: Skin is not pale.  Neurological:     Mental Status: He is alert and oriented to person, place, and time.  Psychiatric:        Behavior: Behavior normal.     BP 118/70   Pulse 81   Ht 6\' 6"  (1.981 m)   Wt 180 lb (81.6 kg)   SpO2 98%   BMI 20.80 kg/m  Wt Readings from Last 3 Encounters:  12/15/19 180 lb (81.6 kg)  03/20/19 177 lb (80.3 kg)  08/02/18 181 lb (82.1 kg) (81 %, Z= 0.88)*   * Growth percentiles are based on CDC (Boys, 2-20 Years) data.    Health Maintenance Due  Topic Date Due  . Hepatitis C Screening  Never done  . COVID-19 Vaccine (1) Never done    There are no preventive care reminders to display for this patient.   Lab Results  Component Value Date   TSH 0.78 03/20/2019   Lab Results  Component Value Date   WBC 4.3 03/26/2019   HGB 15.1 03/26/2019   HCT 43.9 03/26/2019   MCV 87.5 03/26/2019   PLT 181 03/26/2019   Lab Results  Component Value Date   NA 139 03/20/2019   K 4.6 03/20/2019   CO2 27 03/20/2019   GLUCOSE 81 03/20/2019   BUN 14 03/20/2019   CREATININE 1.04 03/20/2019   BILITOT 0.7 03/20/2019   ALKPHOS 93 11/02/2016   AST 17 03/20/2019   ALT 13 03/20/2019   PROT 6.6 03/20/2019   ALBUMIN 4.3 11/02/2016   CALCIUM 9.7 03/20/2019   GFR 119.58 11/02/2016   No results found for: CHOL No results found for: HDL No results found for: LDLCALC No results found for: TRIG No results found for: CHOLHDL Lab Results  Component Value Date   HGBA1C 4.7 (L) 12/19/2016       Assessment & Plan:   Problem List Items Addressed This Visit    None    Visit Diagnoses    Crushing injury of right hand, initial encounter    -  Primary   Relevant Orders   DG Hand Complete Right   Right hand pain       Relevant Orders   DG Hand Complete Right       Personally reviewed x-rays and did not see any indication of fracture.  I suspect he has some type of radial nerve entrapment in the hand that is causing difficulty with dorsiflexion.  Recommend a trial of prednisone and resting the hand and avoiding overusing.  Also recommend ice aggressively.  If not improving some by Thursday then will try to get him in with either sports medicine or Ortho for further evaluation.  Did not put him in a splint today.  He does still have some significant swelling on the dorsum of the hand.  Meds ordered this encounter  Medications  . predniSONE (DELTASONE) 20 MG tablet    Sig: Take 2 tablets (40 mg total) by mouth daily with breakfast.    Dispense:  10 tablet    Refill:  0     Beatrice Lecher, MD

## 2019-12-15 NOTE — Patient Instructions (Signed)
Call if not improving by Thursday Ice and rest the area.

## 2019-12-15 NOTE — Progress Notes (Signed)
He reports that 5 days ago he had a

## 2020-01-15 ENCOUNTER — Encounter: Payer: Self-pay | Admitting: Family Medicine

## 2020-01-15 ENCOUNTER — Encounter (INDEPENDENT_AMBULATORY_CARE_PROVIDER_SITE_OTHER): Payer: PRIVATE HEALTH INSURANCE | Admitting: Family Medicine

## 2020-01-15 NOTE — Progress Notes (Signed)
Attempted to contact pt. No vm

## 2020-01-15 NOTE — Progress Notes (Signed)
Unable to contact patient for virtual visit.

## 2021-03-25 ENCOUNTER — Encounter: Payer: Self-pay | Admitting: Emergency Medicine

## 2021-03-25 ENCOUNTER — Emergency Department
Admission: EM | Admit: 2021-03-25 | Discharge: 2021-03-25 | Disposition: A | Discharge status: Home / Self Care | Payer: Self-pay | Source: Home / Self Care | Attending: Family Medicine | Admitting: Family Medicine

## 2021-03-25 ENCOUNTER — Other Ambulatory Visit: Payer: Self-pay

## 2021-03-25 ENCOUNTER — Emergency Department (INDEPENDENT_AMBULATORY_CARE_PROVIDER_SITE_OTHER): Payer: Self-pay

## 2021-03-25 DIAGNOSIS — M940 Chondrocostal junction syndrome [Tietze]: Secondary | ICD-10-CM

## 2021-03-25 DIAGNOSIS — S29012A Strain of muscle and tendon of back wall of thorax, initial encounter: Secondary | ICD-10-CM

## 2021-03-25 DIAGNOSIS — R072 Precordial pain: Secondary | ICD-10-CM

## 2021-03-25 MED ORDER — PREDNISONE 20 MG PO TABS: ORAL_TABLET | ORAL | 0 refills | Status: DC

## 2021-03-25 NOTE — ED Provider Notes (Signed)
[facility]    CSN: [id number] Arrival date & time: 03/25/21  1110      History   Chief Complaint Chief Complaint  Patient presents with   Chest Pain    HPI Antonio Garcia is a 20 y.o. male.   Patient reports that he has had pain in his upper left back along his scapula for several months, worse with movement.  He denies injury or repetitive activities. Last night he developed intermittent sharp pain in his mid-sternum and left chest, worse with deep inspiration.  He feels mildly short of breath with deep inspiration and feels that his heart rate is fast at times.  He denies radiation of his pain.   He reports that he vapes about 100 times/hour.  The history is provided by the patient and a parent.  Chest Pain Pain location:  Substernal area and L chest Pain quality: sharp   Pain radiates to:  Does not radiate Pain severity:  Moderate Onset quality:  Sudden Duration:  1 day Timing:  Constant Progression:  Unchanged Chronicity:  New Context: breathing, lifting, movement and at rest   Relieved by:  None tried Worsened by:  Coughing, deep breathing and certain positions Ineffective treatments:  None tried Associated symptoms: back pain, palpitations and shortness of breath   Associated symptoms: no abdominal pain, no cough, no diaphoresis, no dysphagia, no fatigue, no fever, no heartburn, no nausea and no syncope   Risk factors comment:  Vaping Back Pain Pain location: left scapula. Radiates to:  Does not radiate Pain severity:  Mild Pain is:  Same all the time Onset quality:  Gradual Duration:  3 months Timing:  Constant Progression:  Unchanged Chronicity:  Chronic Context: not falling, not lifting heavy objects, not occupational injury, not physical stress, not recent illness and not recent injury   Relieved by:  None tried Worsened by:  Deep breathing and movement Associated symptoms: chest pain   Associated symptoms: no abdominal pain and no fever     Past Medical History:  Diagnosis Date   Concussion without loss of consciousness 06/2016   Recurrent herpes labialis     Patient Active Problem List   Diagnosis Date Noted   Anxiety state 11/15/2015   Obsessional thoughts 11/15/2015    Past Surgical History:  Procedure Laterality Date   TYMPANOSTOMY TUBE PLACEMENT Bilateral        Home Medications    Prior to Admission medications   Medication Sig Start Date End Date Taking? Authorizing Provider  predniSONE (DELTASONE) 20 MG tablet Take one tab by mouth twice daily for 4 days, then one daily for 3 days. Take with food. 03/25/21  Yes Lattie Haw, MD  omeprazole (PRILOSEC) 40 MG capsule Take 1 capsule (40 mg total) by mouth daily. Patient not taking: Reported on 03/25/2021 03/20/19   Rodolph Bong, MD  polyethylene glycol powder (GLYCOLAX/MIRALAX) 17 GM/SCOOP powder Take 17 g by mouth daily. Patient not taking: Reported on 03/25/2021 03/20/19   Rodolph Bong, MD  sucralfate (CARAFATE) 1 g tablet Take 1 tablet (1 g total) by mouth 4 (four) times daily. Patient not taking: Reported on 03/25/2021 03/20/19   Rodolph Bong, MD  valACYclovir (VALTREX) 1000 MG tablet TAKE 2 TABLETS BY MOUTH EVERY 12 HOURS FOR 2 DOSES AS NEEDED FOR FEVER BLISTERS 08/02/18   Jomarie Longs, PA-C    Family History Family History  Problem Relation Age of Onset   Hyperlipidemia Mother    Hyperlipidemia Father  Anxiety disorder Father    Hyperlipidemia Maternal Grandfather    Colon cancer Other    Heart attack Other    Anxiety disorder Sister     Social History Social History   Tobacco Use   Smoking status: Every Day    Years: 2.00    Types: Cigarettes   Smokeless tobacco: Never  Vaping Use   Vaping Use: Every day   Start date: 02/25/2019   Substances: Nicotine  Substance Use Topics   Alcohol use: Yes    Comment: 1/2 5th on the weekend   Drug use: No     Allergies   Patient has no known allergies.   Review of Systems Review of  Systems  Constitutional:  Negative for activity change, appetite change, chills, diaphoresis, fatigue and fever.  HENT:  Negative for trouble swallowing.   Respiratory:  Positive for shortness of breath. Negative for cough.   Cardiovascular:  Positive for chest pain and palpitations. Negative for leg swelling and syncope.  Gastrointestinal:  Negative for abdominal pain, heartburn and nausea.  Musculoskeletal:  Positive for back pain.  All other systems reviewed and are negative.   Physical Exam Triage Vital Signs ED Triage Vitals  Enc Vitals Group     BP 03/25/21 1220 112/70     Pulse Rate 03/25/21 1220 80     Resp 03/25/21 1220 16     Temp 03/25/21 1220 98.4 F (36.9 C)     Temp Source 03/25/21 1220 Oral     SpO2 03/25/21 1220 98 %     Weight 03/25/21 1221 185 lb (83.9 kg)     Height 03/25/21 1221 6\' 6"  (1.981 m)     Head Circumference --      Peak Flow --      Pain Score 03/25/21 1220 6     Pain Loc --      Pain Edu? --      Excl. in [place]? --    No data found.  Updated Vital Signs BP 112/70 (BP Location: Left Arm)   Pulse 80   Temp 98.4 F (36.9 C) (Oral)   Resp 16   Ht 6\' 6"  (1.981 m)   Wt 83.9 kg   SpO2 98%   BMI 21.38 kg/m   Visual Acuity Right Eye Distance:   Left Eye Distance:   Bilateral Distance:    Right Eye Near:   Left Eye Near:    Bilateral Near:     Physical Exam Vitals and nursing note reviewed.  Constitutional:      General: He is not in acute distress.    Appearance: He is not ill-appearing.  HENT:     Head: Normocephalic.     Nose: Nose normal.     Mouth/Throat:     Pharynx: Oropharynx is clear.  Eyes:     Conjunctiva/sclera: Conjunctivae normal.     Pupils: Pupils are equal, round, and reactive to light.  Cardiovascular:     Rate and Rhythm: Normal rate and regular rhythm.     Heart sounds: Normal heart sounds.  Pulmonary:     Breath sounds: Normal breath sounds.       Comments: There is distinct tenderness over medial and  inferior edges of right scapula.  Pain elicited by resisted abduction of right shoulder while palpating right rhomboid muscles.  Chest:       Comments: Chest:  Distinct tenderness to palpation over the mid-sternum  as noted on diagram.  Palpation at this location recreates  patient's chest pain.   Abdominal:     Palpations: Abdomen is soft.     Tenderness: There is no abdominal tenderness.  Musculoskeletal:     Cervical back: Neck supple.     Right lower leg: No edema.     Left lower leg: No edema.  Lymphadenopathy:     Cervical: No cervical adenopathy.  Skin:    General: Skin is warm and dry.     Findings: No rash.  Neurological:     Mental Status: He is alert and oriented to person, place, and time.     UC Treatments / Results  Labs (all labs ordered are listed, but only abnormal results are displayed) Labs Reviewed - No data to display  EKG  Rate:   61 BPM PR:   150 msec QT:   364 msec QTcH:   366 msec QRSD:   108 msec QRS axis:   -10 degrees Interpretation:  within normal limits; normal sinus rhythm    Radiology DG Chest 2 View  Result Date: 03/25/2021 CLINICAL DATA:  Right scapular for several months Left anterior chest pain and sternal beginning yesterday EXAM: CHEST - 2 VIEW COMPARISON:  07/16/2010 FINDINGS: The heart size and mediastinal contours are within normal limits. Both lungs are clear. The visualized skeletal structures are unremarkable. IMPRESSION: No active cardiopulmonary disease. Electronically Signed   By: Acquanetta Belling M.D.   On: 03/25/2021 14:36    Procedures Procedures (including critical care time)  Medications Ordered in UC Medications - No data to display  Initial Impression / Assessment and Plan / UC Course  I have reviewed the triage vital signs and the nursing notes.  Pertinent labs & imaging results that were available during my care of the patient were reviewed by me and considered in my medical decision making (see chart for  details).  Patient's history and past chart records reviewed.  He had similar left chest pain 01/30/19 evaluated at a [facility] ED.   Normal chest x-ray and EKG reassuring. Begin prednisone burst/taper. Given sprain treatment instructions with range of motion and stretching exercises.  Followup with Dr. Rodney [facility] ([facility]) if not improving about two weeks.   Final Clinical Impressions(s) / UC Diagnoses   Final diagnoses:  Costochondritis  Rhomboid muscle strain, initial encounter     Discharge Instructions      Apply ice pack for 20 to 30 minutes, 3 to 4 times daily  Continue until pain decreases.  Begin rhomboid muscle exercises.  May take Tylenol once or twice daily as needed for pain.     ED Prescriptions     Medication Sig Dispense Auth. Provider   predniSONE (DELTASONE) 20 MG tablet Take one tab by mouth twice daily for 4 days, then one daily for 3 days. Take with food. 11 tablet Lattie Haw, MD         Lattie Haw, MD 03/26/21 [phone]

## 2021-03-25 NOTE — ED Triage Notes (Signed)
Pt here with mom  Ongoing chest pain, SOB  & left shoulder pain  RN checked HR (82) & O2 sat  (98) in waiting area  Pain to shoulder is in between shoulder blades Pt states his heart rate goes up & his chest pain increases - unsure what rate is "feels fast" Vaping x 2 years  Pt states he vapes approx 100 times an hour  None since yesterday  COVID 2021 & 2022 No COVID  vaccine

## 2021-03-25 NOTE — Discharge Instructions (Addendum)
Apply ice pack for 20 to 30 minutes, 3 to 4 times daily  Continue until pain decreases.  Begin rhomboid muscle exercises.  May take Tylenol once or twice daily as needed for pain.

## 2022-03-06 IMAGING — DX DG HAND COMPLETE 3+V*R*
3 series · 3 of 3 positions shown · non-contrast
Comparison: Right wrist 08/08/2016

CLINICAL DATA: Hand injury.

EXAM:
RIGHT HAND - COMPLETE 3+ VIEW

[hand pa]
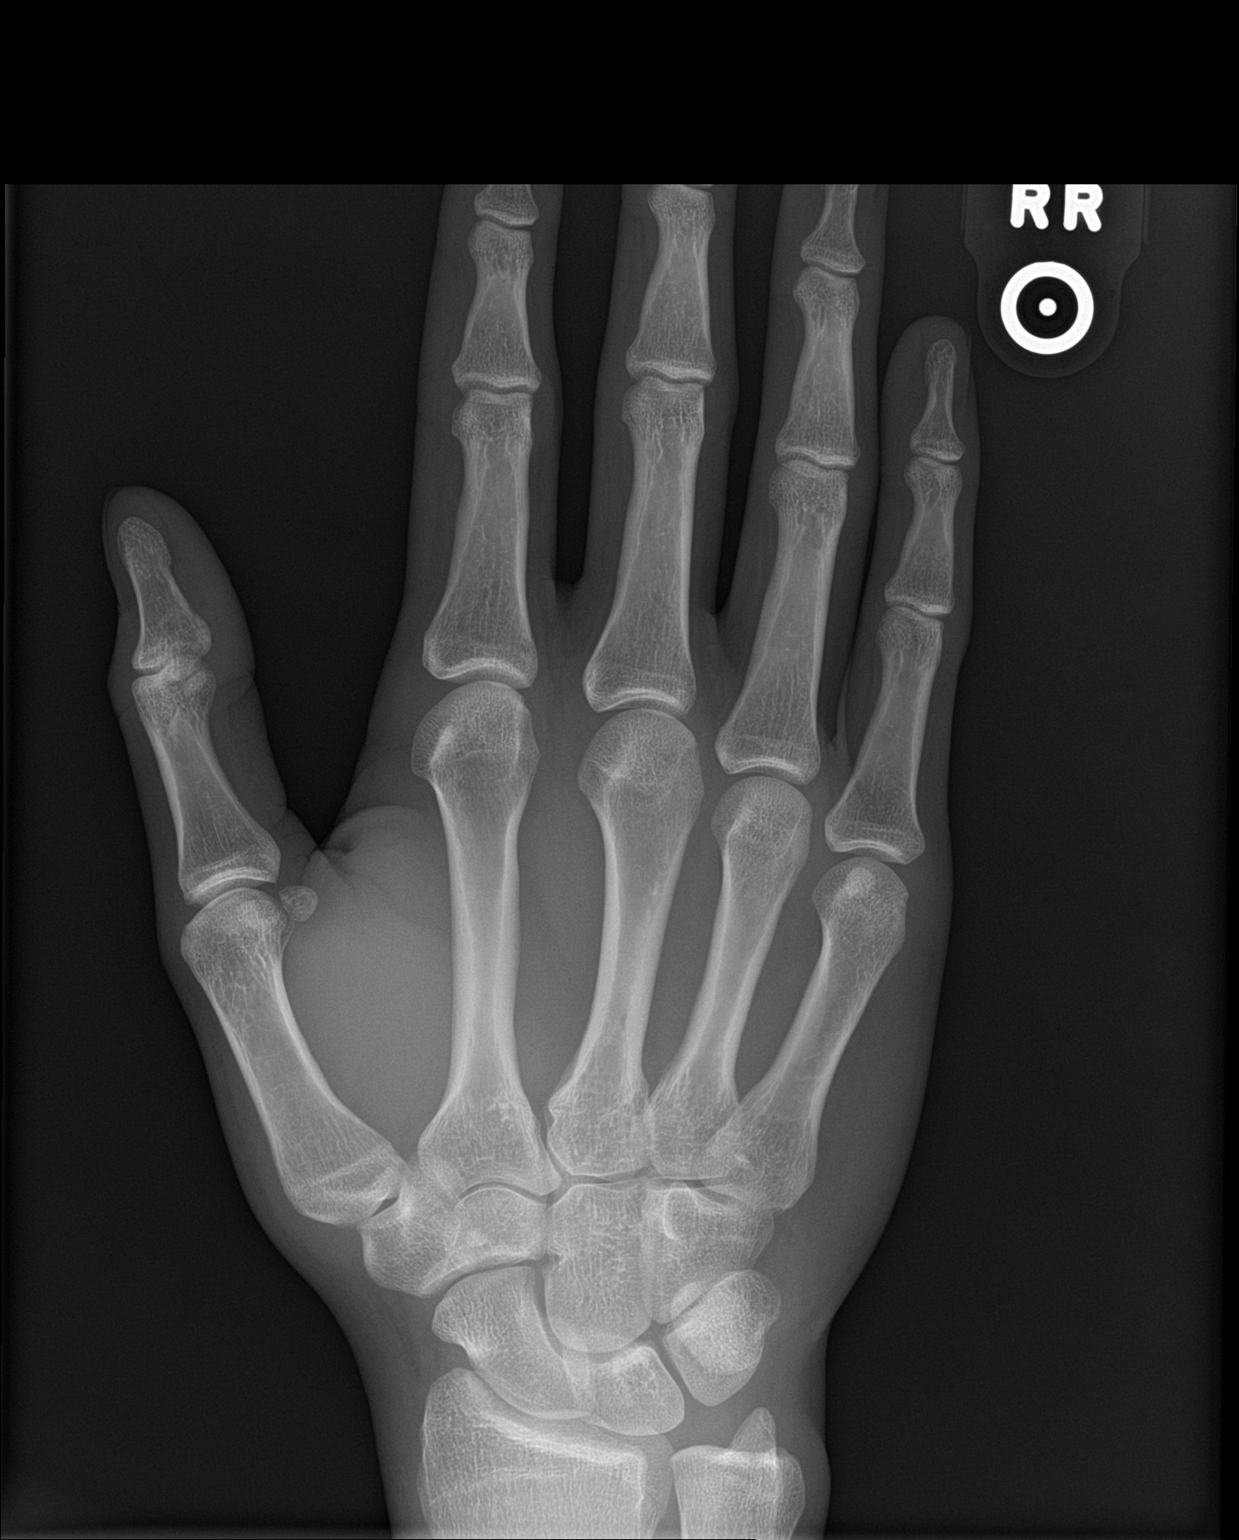

[hand obl]
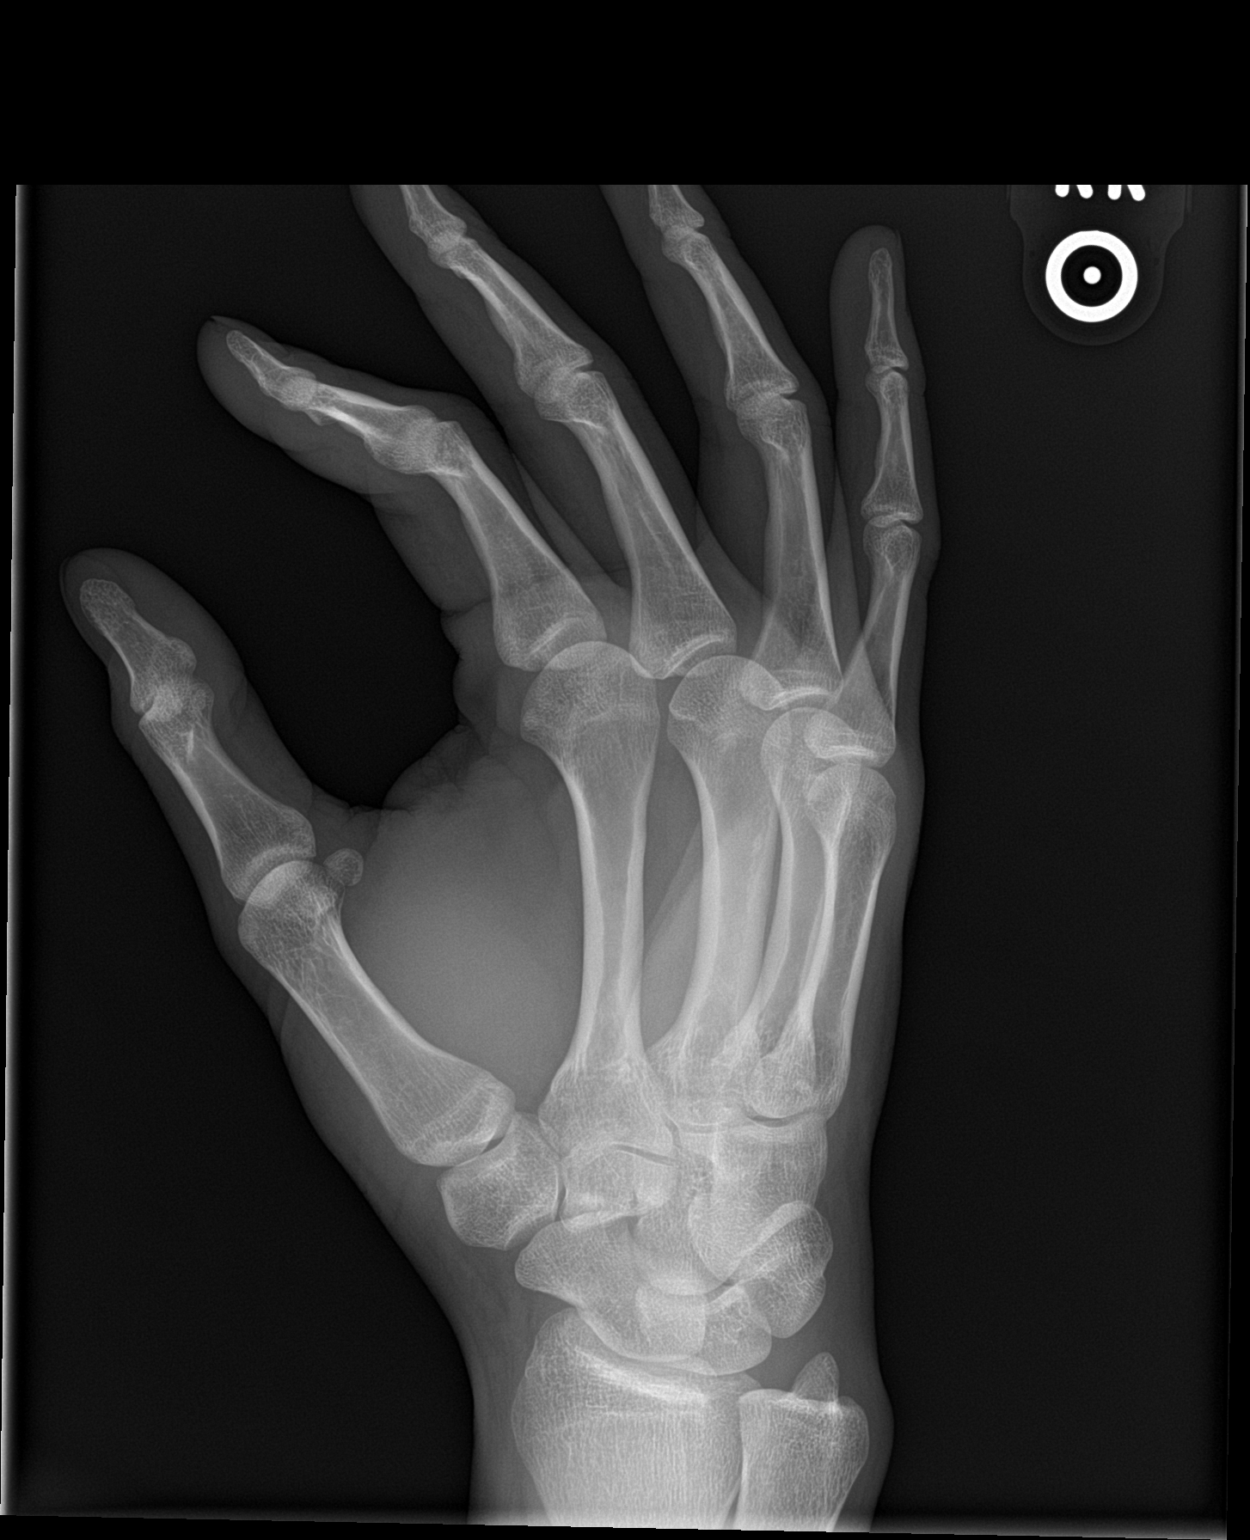

[hand lat]
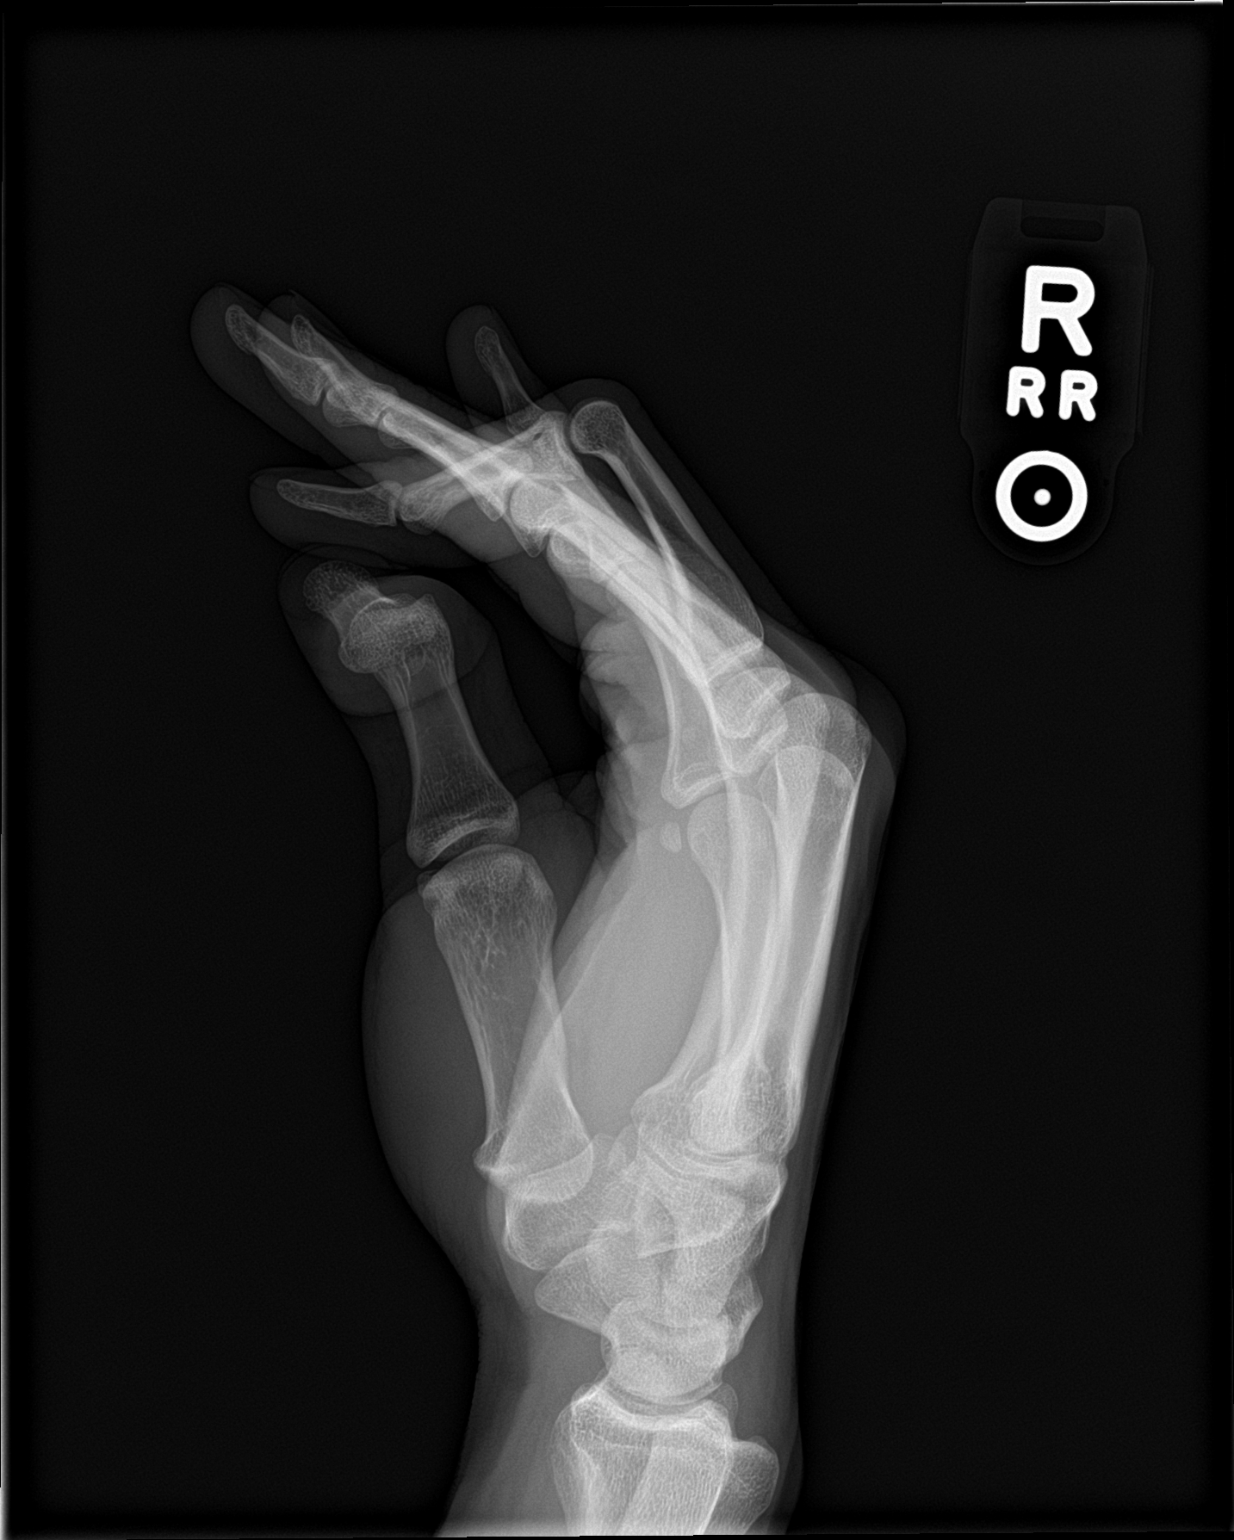

[3 of 3 positions shown; findings below may reference images not displayed]

FINDINGS: There is no evidence of fracture or dislocation. There is no
evidence of arthropathy or other focal bone abnormality. Soft
tissues are unremarkable.
IMPRESSION: Negative.

## 2022-07-03 ENCOUNTER — Ambulatory Visit
Admission: EM | Admit: 2022-07-03 | Discharge: 2022-07-03 | Disposition: A | Discharge status: Home / Self Care | Payer: Commercial Managed Care - PPO | Attending: Family Medicine | Admitting: Family Medicine

## 2022-07-03 DIAGNOSIS — R059 Cough, unspecified: Secondary | ICD-10-CM

## 2022-07-03 DIAGNOSIS — J111 Influenza due to unidentified influenza virus with other respiratory manifestations: Secondary | ICD-10-CM

## 2022-07-03 DIAGNOSIS — R0989 Other specified symptoms and signs involving the circulatory and respiratory systems: Secondary | ICD-10-CM

## 2022-07-03 MED ORDER — AZITHROMYCIN 250 MG PO TABS: 250.0000 mg | ORAL_TABLET | Freq: Every day | ORAL | 0 refills | Status: DC

## 2022-07-03 MED ORDER — PREDNISONE 20 MG PO TABS: ORAL_TABLET | ORAL | 0 refills | Status: DC

## 2022-07-03 NOTE — Discharge Instructions (Addendum)
Advised patient to take medications as directed with food to completion.  Encouraged patient to increase daily water intake to 64 ounces per day while taking these medications.  Advised if symptoms worsen and/or unresolved please follow-up with PCP or here for further evaluation. 

## 2022-07-03 NOTE — ED Triage Notes (Signed)
Pt c/o flu like sxs since Tues of last week. Woke up with fever Wed. Last fever was Fri. Assumes flu. Continued shortness of breath and cough. Ibuprofen and OTC cough and cold prn.

## 2022-07-03 NOTE — ED Provider Notes (Signed)
[facility]    CSN: [id number] Arrival date & time: 07/03/22  1545      History   Chief Complaint Chief Complaint  Patient presents with   Fever   Cough   Nasal Congestion    HPI Antonio Garcia is a 22 y.o. male.   HPI   22 year old male male presents of flulike symptoms since Tuesday of last week.  Past Medical History:  Diagnosis Date   Concussion without loss of consciousness 06/2016   Recurrent herpes labialis     Patient Active Problem List   Diagnosis Date Noted   Anxiety state 11/15/2015   Obsessional thoughts 11/15/2015    Past Surgical History:  Procedure Laterality Date   TYMPANOSTOMY TUBE PLACEMENT Bilateral        Home Medications    Prior to Admission medications   Medication Sig Start Date End Date Taking? Authorizing Provider  azithromycin (ZITHROMAX) 250 MG tablet Take 1 tablet (250 mg total) by mouth daily. Take first 2 tablets together, then 1 every day until finished. 07/03/22  Yes Eliezer Lofts, FNP  predniSONE (DELTASONE) 20 MG tablet Take 3 tabs PO daily x 5 days. 07/03/22  Yes Eliezer Lofts, FNP  valACYclovir (VALTREX) 1000 MG tablet TAKE 2 TABLETS BY MOUTH EVERY 12 HOURS FOR 2 DOSES AS NEEDED FOR FEVER BLISTERS 08/02/18   Donella Stade, PA-C    Family History Family History  Problem Relation Age of Onset   Hyperlipidemia Mother    Hyperlipidemia Father    Anxiety disorder Father    Hyperlipidemia Maternal Grandfather    Colon cancer Other    Heart attack Other    Anxiety disorder Sister     Social History Social History   Tobacco Use   Smoking status: Every Day    Years: 2.00    Types: Cigarettes   Smokeless tobacco: Never  Vaping Use   Vaping Use: Every day   Start date: 02/25/2019   Substances: Nicotine  Substance Use Topics   Alcohol use: Yes    Comment: 1/2 5th on the weekend   Drug use: No     Allergies   Patient has no known allergies.   Review of Systems Review of Systems   Constitutional:  Positive for fatigue and fever.  HENT:  Positive for congestion.   Respiratory:  Positive for cough.      Physical Exam Triage Vital Signs ED Triage Vitals [07/03/22 1559]  Enc Vitals Group     BP 116/79     Pulse Rate 87     Resp 18     Temp 98.1 F (36.7 C)     Temp Source Oral     SpO2 99 %     Weight 185 lb (83.9 kg)     Height      Head Circumference      Peak Flow      Pain Score 0     Pain Loc      Pain Edu?      Excl. in [place]?    No data found.  Updated Vital Signs BP 116/79 (BP Location: Right Arm)   Pulse 87   Temp 98.1 F (36.7 C) (Oral)   Resp 18   Wt 185 lb (83.9 kg)   SpO2 99%   BMI 21.38 kg/m   Visual Acuity Right Eye Distance:   Left Eye Distance:   Bilateral Distance:    Right Eye Near:   Left Eye Near:  Bilateral Near:     Physical Exam Vitals and nursing note reviewed.  Constitutional:      Appearance: Normal appearance. He is normal weight. He is ill-appearing.  HENT:     Head: Normocephalic and atraumatic.     Right Ear: Tympanic membrane, ear canal and external ear normal.     Left Ear: Tympanic membrane, ear canal and external ear normal.     Mouth/Throat:     Mouth: Mucous membranes are moist.     Pharynx: Oropharynx is clear.  Eyes:     Extraocular Movements: Extraocular movements intact.     Conjunctiva/sclera: Conjunctivae normal.     Pupils: Pupils are equal, round, and reactive to light.  Cardiovascular:     Rate and Rhythm: Normal rate and regular rhythm.     Pulses: Normal pulses.     Heart sounds: Normal heart sounds.  Pulmonary:     Effort: Pulmonary effort is normal.     Breath sounds: Normal breath sounds. No wheezing, rhonchi or rales.     Comments: Infrequent nonproductive cough noted on exam Abdominal:     General: Bowel sounds are normal.  Musculoskeletal:        General: Normal range of motion.     Cervical back: Normal range of motion and neck supple.  Skin:    General: Skin is  warm and dry.  Neurological:     General: No focal deficit present.     Mental Status: He is alert and oriented to person, place, and time. Mental status is at baseline.      UC Treatments / Results  Labs (all labs ordered are listed, but only abnormal results are displayed) Labs Reviewed - No data to display  EKG   Radiology No results found.  Procedures Procedures (including critical care time)  Medications Ordered in UC Medications - No data to display  Initial Impression / Assessment and Plan / UC Course  I have reviewed the triage vital signs and the nursing notes.  Pertinent labs & imaging results that were available during my care of the patient were reviewed by me and considered in my medical decision making (see chart for details).     MDM: 1.  Chest congestion-Rx Zithromax; 2.  Cough-Rx'd prednisone. Advised patient to take medications as directed with food to completion.  Encouraged patient to increase daily water intake to 64 ounces per day while taking these medications.  Advised if symptoms worsen and/or unresolved please follow-up with PCP or here for further evaluation.  Patient discharged home, hemodynamically stable. Final Clinical Impressions(s) / UC Diagnoses   Final diagnoses:  Chest congestion  Cough, unspecified type  Influenza-like illness     Discharge Instructions      Advised patient to take medications as directed with food to completion.  Encouraged patient to increase daily water intake to 64 ounces per day while taking these medications.  Advised if symptoms worsen and/or unresolved please follow-up with PCP or here for further evaluation.     ED Prescriptions     Medication Sig Dispense Auth. Provider   azithromycin (ZITHROMAX) 250 MG tablet Take 1 tablet (250 mg total) by mouth daily. Take first 2 tablets together, then 1 every day until finished. 6 tablet Trevor Iha, FNP   predniSONE (DELTASONE) 20 MG tablet Take 3 tabs PO  daily x 5 days. 15 tablet Trevor Iha, FNP      PDMP not reviewed this encounter.   Trevor Iha, FNP 07/03/22 1659

## 2023-06-15 IMAGING — DX DG CHEST 2V
2 series · 2 of 2 positions shown · non-contrast
Comparison: 07/16/2010

CLINICAL DATA: Right scapular for several months

Left anterior chest pain and sternal beginning yesterday
EXAM:
CHEST - 2 VIEW

[chest pa]
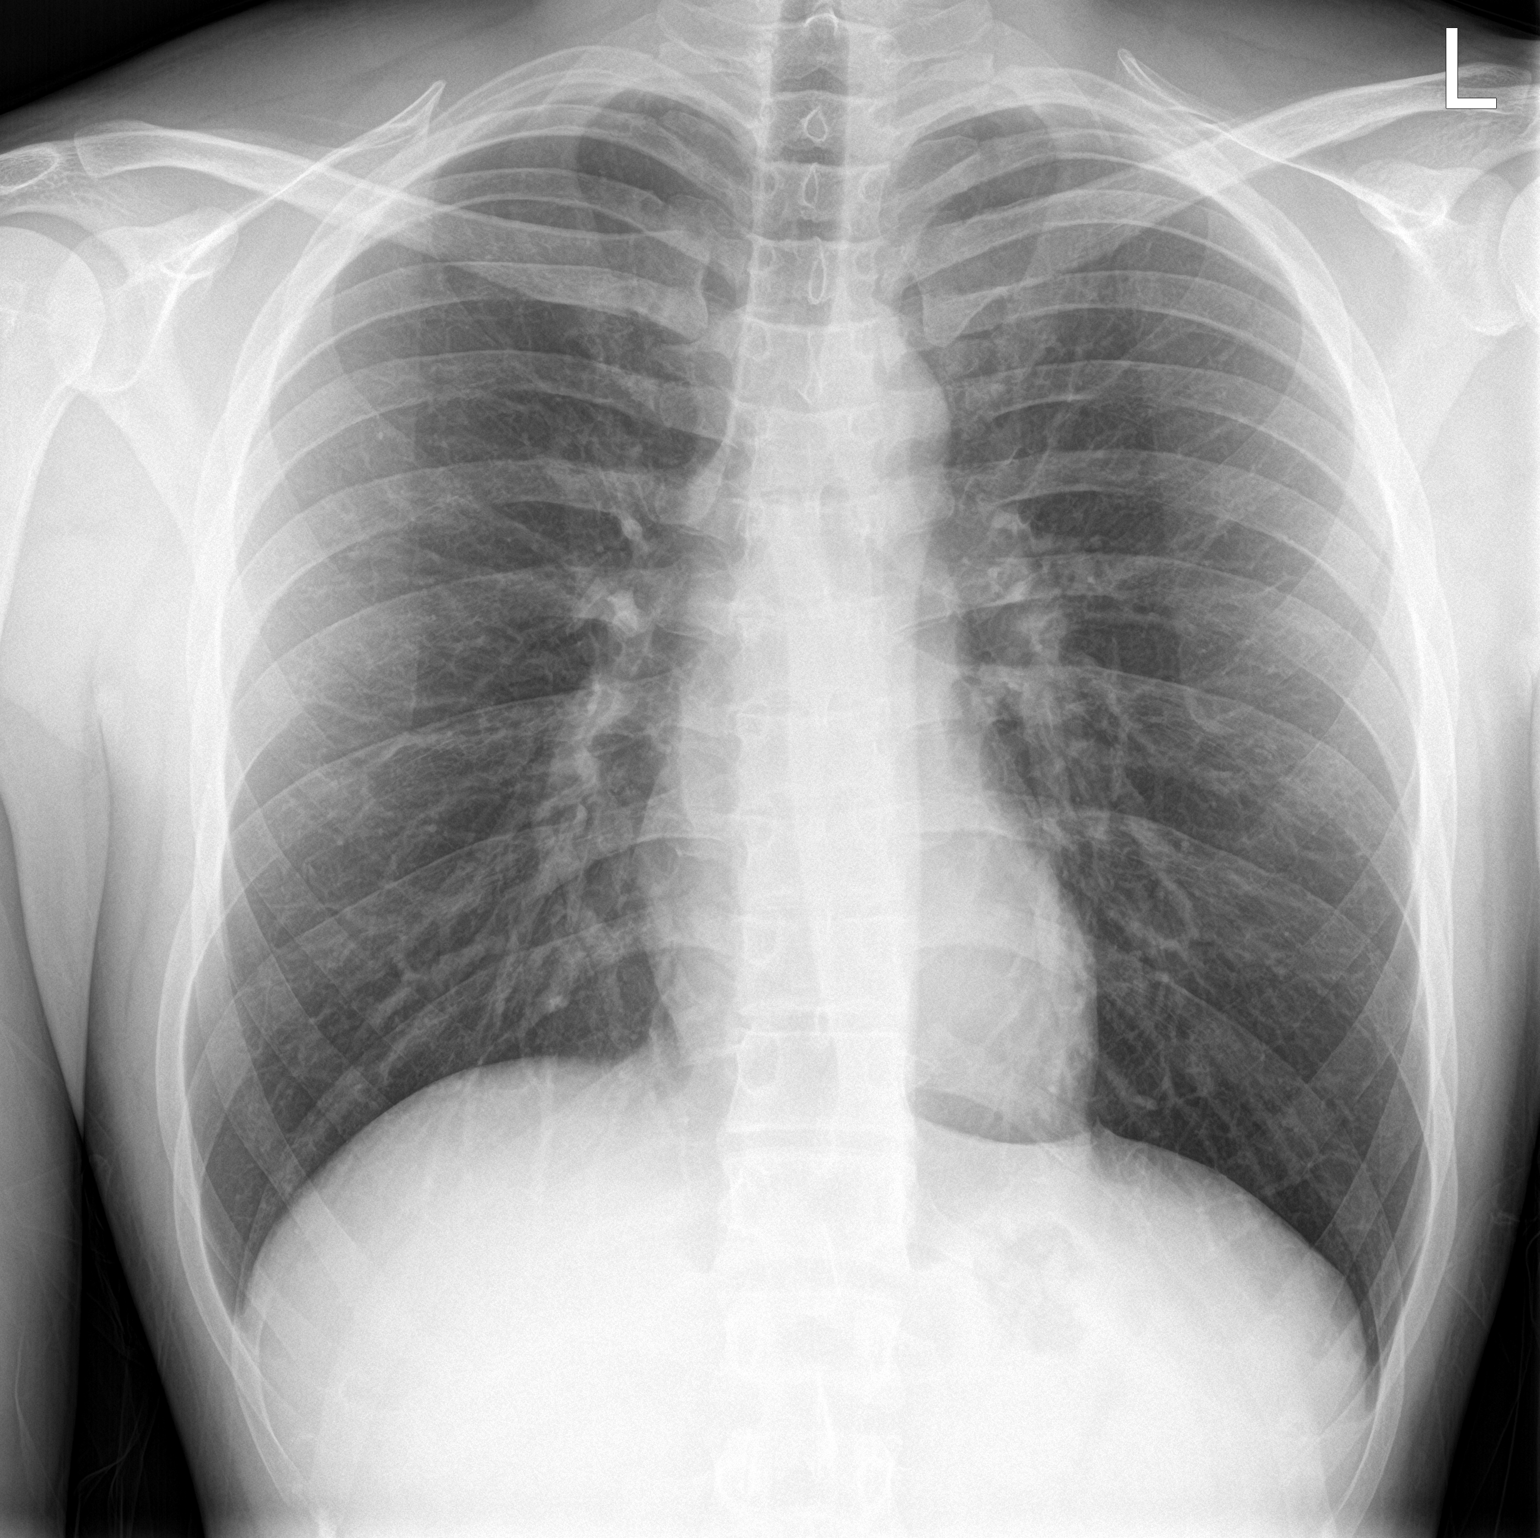

[chest lat]
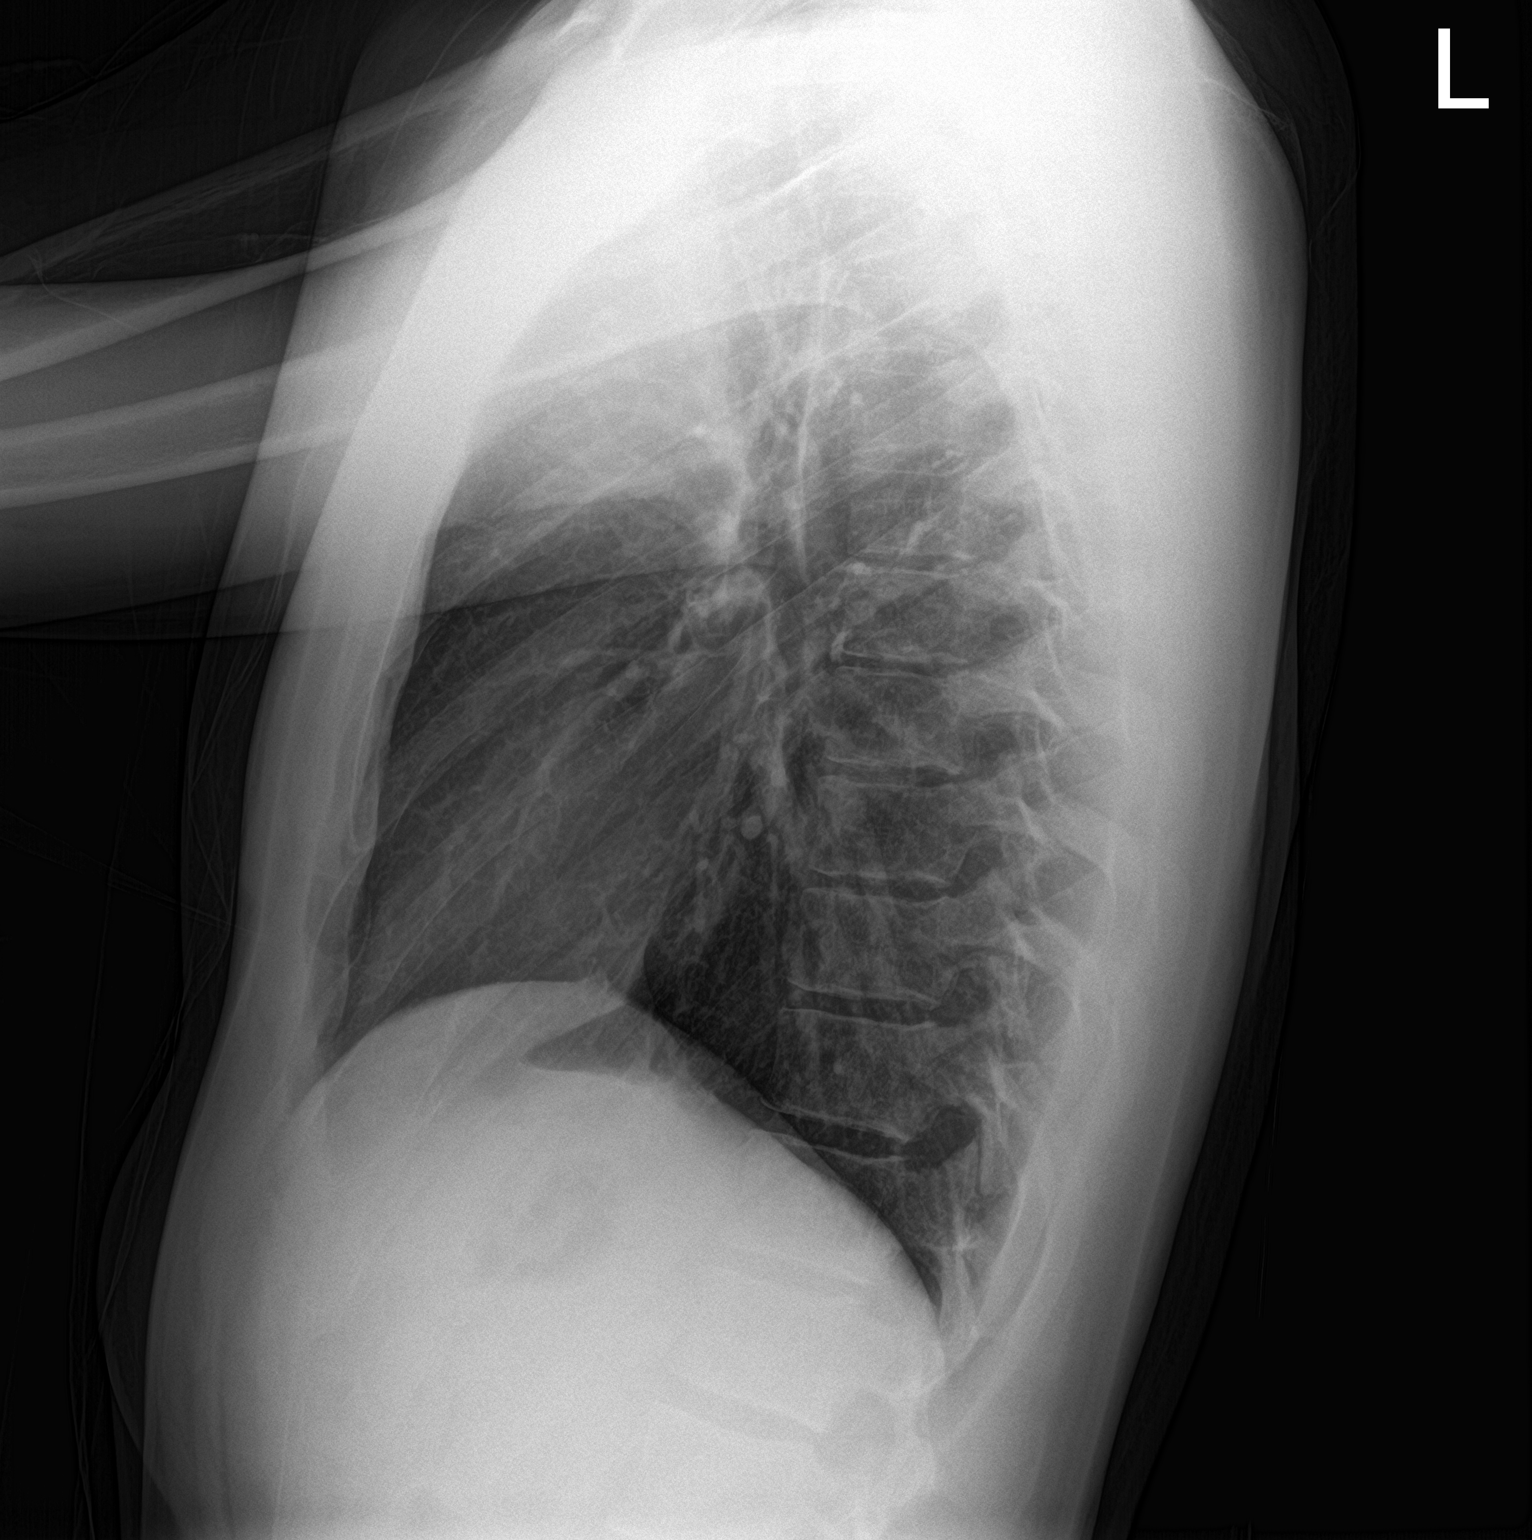

[2 of 2 positions shown; findings below may reference images not displayed]

FINDINGS: The heart size and mediastinal contours are within normal limits.
Both lungs are clear. The visualized skeletal structures are
unremarkable.
IMPRESSION: No active cardiopulmonary disease.

## 2023-11-21 ENCOUNTER — Encounter: Payer: Self-pay | Admitting: Emergency Medicine

## 2023-11-21 ENCOUNTER — Ambulatory Visit
Admission: EM | Admit: 2023-11-21 | Discharge: 2023-11-21 | Disposition: A | Discharge status: Home / Self Care | Attending: Family Medicine | Admitting: Family Medicine

## 2023-11-21 DIAGNOSIS — L739 Follicular disorder, unspecified: Secondary | ICD-10-CM

## 2023-11-21 DIAGNOSIS — R590 Localized enlarged lymph nodes: Secondary | ICD-10-CM | POA: Diagnosis not present

## 2023-11-21 MED ORDER — DOXYCYCLINE HYCLATE 100 MG PO CAPS: 100.0000 mg | ORAL_CAPSULE | Freq: Two times a day (BID) | ORAL | 0 refills | Status: AC

## 2023-11-21 NOTE — ED Provider Notes (Signed)
 [facility]    CSN: [id number] Arrival date & time: 11/21/23  1546      History   Chief Complaint Chief Complaint  Patient presents with   Otalgia    HPI Antonio Garcia is a 23 y.o. male.   HPI Patient complains of ear pain but actually he was talking about pain behind his ear he has a lymph node that is in the back of his neck behind his ear that is swollen and tender.  It is making his neck little sore.  He states he noticed it about 5 or 6 days ago.  He is concerned not knowing what it is.  No cough cold or runny nose.  No other complaint.  No fever or malaise  Past Medical History:  Diagnosis Date   Concussion without loss of consciousness 06/2016   Recurrent herpes labialis     Patient Active Problem List   Diagnosis Date Noted   Anxiety state 11/15/2015   Obsessional thoughts 11/15/2015    Past Surgical History:  Procedure Laterality Date   TYMPANOSTOMY TUBE PLACEMENT Bilateral        Home Medications    Prior to Admission medications   Medication Sig Start Date End Date Taking? Authorizing Provider  doxycycline (VIBRAMYCIN) 100 MG capsule Take 1 capsule (100 mg total) by mouth 2 (two) times daily. 11/21/23  Yes Stephany Ehrich, MD  valACYclovir  (VALTREX ) 1000 MG tablet TAKE 2 TABLETS BY MOUTH EVERY 12 HOURS FOR 2 DOSES AS NEEDED FOR FEVER BLISTERS 08/02/18   Araceli Knight, PA-C    Family History Family History  Problem Relation Age of Onset   Hyperlipidemia Mother    Hyperlipidemia Father    Anxiety disorder Father    Hyperlipidemia Maternal Grandfather    Colon cancer Other    Heart attack Other    Anxiety disorder Sister     Social History Social History   Tobacco Use   Smoking status: Former    Types: Cigarettes   Smokeless tobacco: Never  Vaping Use   Vaping status: Former   Start date: 02/25/2019   Substances: Nicotine  Substance Use Topics   Alcohol use: Yes    Comment: 1/2 5th on the weekend   Drug use: No      Allergies   Patient has no known allergies.   Review of Systems Review of Systems  See HPI Physical Exam Triage Vital Signs ED Triage Vitals  Encounter Vitals Group     BP 11/21/23 1558 112/74     Systolic BP Percentile --      Diastolic BP Percentile --      Pulse Rate 11/21/23 1558 86     Resp 11/21/23 1558 18     Temp 11/21/23 1558 98.9 F (37.2 C)     Temp Source 11/21/23 1558 Oral     SpO2 11/21/23 1558 96 %     Weight 11/21/23 1600 185 lb (83.9 kg)     Height 11/21/23 1600 6\' 6"  (1.981 m)     Head Circumference --      Peak Flow --      Pain Score 11/21/23 1600 3     Pain Loc --      Pain Education --      Exclude from Growth Chart --    No data found.  Updated Vital Signs BP 112/74 (BP Location: Right Arm)   Pulse 86   Temp 98.9 F (37.2 C) (Oral)   Resp 18  Ht 6\' 6"  (1.981 m)   Wt 83.9 kg   SpO2 96%   BMI 21.38 kg/m       Physical Exam Constitutional:      General: He is not in acute distress.    Appearance: He is well-developed.  HENT:     Head: Normocephalic and atraumatic.     Right Ear: Tympanic membrane normal.     Left Ear: Tympanic membrane normal.     Nose: Nose normal. No rhinorrhea.     Mouth/Throat:     Mouth: Mucous membranes are moist.     Pharynx: No posterior oropharyngeal erythema.  Eyes:     Conjunctiva/sclera: Conjunctivae normal.     Pupils: Pupils are equal, round, and reactive to light.  Neck:     Comments: Patient has a single tender posterior cervical node on the right just under the occiput Cardiovascular:     Rate and Rhythm: Normal rate.  Pulmonary:     Effort: Pulmonary effort is normal. No respiratory distress.  Abdominal:     General: There is no distension.     Palpations: Abdomen is soft.  Musculoskeletal:        General: Normal range of motion.     Cervical back: Normal range of motion.  Lymphadenopathy:     Cervical: Cervical adenopathy present.  Skin:    General: Skin is warm and dry.      Findings: Rash present.     Comments: Patient has a number of pustules across the lower part of his posterior hairline from a close shave haircut    Neurological:     Mental Status: He is alert.      UC Treatments / Results  Labs (all labs ordered are listed, but only abnormal results are displayed) Labs Reviewed - No data to display  EKG   Radiology No results found.  Procedures Procedures (including critical care time)  Medications Ordered in UC Medications - No data to display  Initial Impression / Assessment and Plan / UC Course  I have reviewed the triage vital signs and the nursing notes.  Pertinent labs & imaging results that were available during my care of the patient were reviewed by me and considered in my medical decision making (see chart for details).     I explained to the patient that this lymph node was reactive due to the infection in his scalp and will go away with treatment.  Reassurance given Final Clinical Impressions(s) / UC Diagnoses   Final diagnoses:  Folliculitis  Cervical lymphadenopathy   Discharge Instructions      Take the antibiotic 2 x a day The swollen lymph node is from the rash on your scalp  ED Prescriptions     Medication Sig Dispense Auth. Provider   doxycycline (VIBRAMYCIN) 100 MG capsule Take 1 capsule (100 mg total) by mouth 2 (two) times daily. 14 capsule Stephany Ehrich, MD      PDMP not reviewed this encounter.   Stephany Ehrich, MD 11/21/23 [phone]

## 2023-11-21 NOTE — ED Triage Notes (Signed)
 Patient c/o right ear pain x 6 days then noticed a swollen lymph node on his neck near his right ear.  The swollen lymph node did not start bothering patient till x 4 days.  Patient has taken Tylenol and Advil for pain.

## 2023-11-21 NOTE — Discharge Instructions (Signed)
 Take the antibiotic 2 x a day The swollen lymph node is from the rash on your scalp

## 2023-11-26 ENCOUNTER — Ambulatory Visit
Admission: EM | Admit: 2023-11-26 | Discharge: 2023-11-26 | Disposition: A | Discharge status: Home / Self Care | Attending: Internal Medicine | Admitting: Internal Medicine

## 2023-11-26 ENCOUNTER — Other Ambulatory Visit: Payer: Self-pay

## 2023-11-26 ENCOUNTER — Emergency Department (HOSPITAL_BASED_OUTPATIENT_CLINIC_OR_DEPARTMENT_OTHER): Admitting: Radiology

## 2023-11-26 ENCOUNTER — Emergency Department (HOSPITAL_BASED_OUTPATIENT_CLINIC_OR_DEPARTMENT_OTHER)
Admission: EM | Admit: 2023-11-26 | Discharge: 2023-11-26 | Disposition: A | Discharge status: Home / Self Care | Attending: Emergency Medicine | Admitting: Emergency Medicine

## 2023-11-26 ENCOUNTER — Encounter (HOSPITAL_BASED_OUTPATIENT_CLINIC_OR_DEPARTMENT_OTHER): Payer: Self-pay

## 2023-11-26 ENCOUNTER — Emergency Department (HOSPITAL_BASED_OUTPATIENT_CLINIC_OR_DEPARTMENT_OTHER)

## 2023-11-26 DIAGNOSIS — I4510 Unspecified right bundle-branch block: Secondary | ICD-10-CM

## 2023-11-26 DIAGNOSIS — R0789 Other chest pain: Secondary | ICD-10-CM | POA: Diagnosis present

## 2023-11-26 DIAGNOSIS — R079 Chest pain, unspecified: Secondary | ICD-10-CM | POA: Diagnosis not present

## 2023-11-26 DIAGNOSIS — R519 Headache, unspecified: Secondary | ICD-10-CM | POA: Insufficient documentation

## 2023-11-26 LAB — CBC
HCT: 43.7 % (ref 39.0–52.0)
Hemoglobin: 15.6 g/dL (ref 13.0–17.0)
MCH: 30.9 pg (ref 26.0–34.0)
MCHC: 35.7 g/dL (ref 30.0–36.0)
MCV: 86.5 fL (ref 80.0–100.0)
Platelets: 211 10*3/uL (ref 150–400)
RBC: 5.05 MIL/uL (ref 4.22–5.81)
RDW: 11.8 % (ref 11.5–15.5)
WBC: 4.6 10*3/uL (ref 4.0–10.5)
nRBC: 0 % (ref 0.0–0.2)

## 2023-11-26 LAB — TROPONIN T, HIGH SENSITIVITY: Troponin T High Sensitivity: <15 ng/L (ref <19)

## 2023-11-26 LAB — BASIC METABOLIC PANEL WITH GFR
Anion gap: 11 (ref 5–15)
BUN: 21 mg/dL — ABNORMAL HIGH (ref 6–20)
CO2: 27 mmol/L (ref 22–32)
Calcium: 10 mg/dL (ref 8.9–10.3)
Chloride: 102 mmol/L (ref 98–111)
Creatinine, Ser: 1.13 mg/dL (ref 0.61–1.24)
GFR, Estimated: >60 mL/min (ref >60)
Glucose, Bld: 88 mg/dL (ref 70–99)
Potassium: 4 mmol/L (ref 3.5–5.1)
Sodium: 140 mmol/L (ref 135–145)

## 2023-11-26 NOTE — ED Notes (Signed)
 Patient is being discharged from the Urgent Care and sent to the Emergency Department via pov . Per stanhope np, patient is in need of higher level of care due to new right bundle branch block. Patient is aware and verbalizes understanding of plan of care.  Vitals:   11/26/23 1652  BP: 124/77  Pulse: 87  Resp: 16  Temp: 99.2 F (37.3 C)  SpO2: 99%

## 2023-11-26 NOTE — ED Triage Notes (Signed)
 Has c/o left shoulder pain "for a while" but exacerbated lately. Has a tingly feeling. Has had ibuprofen and tylenol.

## 2023-11-26 NOTE — Discharge Instructions (Signed)
 You were seen in the emergency department today for concerns of chest pain and abnormal headaches.  Your labs and imaging were thankfully reassuring with no obvious signs to suggest any evidence of a heart attack or other abnormalities.  Your CT scan of your head was also thankfully normal.  I would recommend following up with a primary care provider and potentially a cardiologist if your symptoms or not improving.  For any concerns of worsening symptoms, return to the emergency department.  Take ibuprofen and naproxen for pain and anti-inflammatory properties over the next week.

## 2023-11-26 NOTE — ED Provider Notes (Signed)
 [facility]    CSN: [id number] Arrival date & time: 11/26/23  1636      History   Chief Complaint Chief Complaint  Patient presents with  . Shoulder Pain    HPI Isaiah Cianci is a 23 y.o. male.   Keontae Levingston is a 23 y.o. male presenting for chief complaint of intermittent dull pain to the left upper shoulder blade and the left chest that started "a while ago" but has flared up several times randomly over the last week.  Episodes consist of sharp pain to the left chest and left shoulder   Shoulder Pain   Past Medical History:  Diagnosis Date  . Concussion without loss of consciousness 06/2016  . Recurrent herpes labialis     Patient Active Problem List   Diagnosis Date Noted  . Anxiety state 11/15/2015  . Obsessional thoughts 11/15/2015    Past Surgical History:  Procedure Laterality Date  . TYMPANOSTOMY TUBE PLACEMENT Bilateral        Home Medications    Prior to Admission medications   Medication Sig Start Date End Date Taking? Authorizing Provider  doxycycline  (VIBRAMYCIN ) 100 MG capsule Take 1 capsule (100 mg total) by mouth 2 (two) times daily. 11/21/23   Stephany Ehrich, MD  valACYclovir  (VALTREX ) 1000 MG tablet TAKE 2 TABLETS BY MOUTH EVERY 12 HOURS FOR 2 DOSES AS NEEDED FOR FEVER BLISTERS 08/02/18   Araceli Knight, PA-C    Family History Family History  Problem Relation Age of Onset  . Hyperlipidemia Mother   . Hyperlipidemia Father   . Anxiety disorder Father   . Hyperlipidemia Maternal Grandfather   . Colon cancer Other   . Heart attack Other   . Anxiety disorder Sister     Social History Social History   Tobacco Use  . Smoking status: Former    Types: Cigarettes  . Smokeless tobacco: Never  Vaping Use  . Vaping status: Former  . Start date: 02/25/2019  . Substances: Nicotine  Substance Use Topics  . Alcohol use: Yes    Comment: 1/2 5th on the weekend  . Drug use: No     Allergies   Patient has no known  allergies.   Review of Systems Review of Systems   Physical Exam Triage Vital Signs ED Triage Vitals  Encounter Vitals Group     BP 11/26/23 1652 124/77     Systolic BP Percentile --      Diastolic BP Percentile --      Pulse Rate 11/26/23 1652 87     Resp 11/26/23 1652 16     Temp 11/26/23 1652 99.2 F (37.3 C)     Temp src --      SpO2 11/26/23 1652 99 %     Weight --      Height --      Head Circumference --      Peak Flow --      Pain Score 11/26/23 1655 4     Pain Loc --      Pain Education --      Exclude from Growth Chart --    No data found.  Updated Vital Signs BP 124/77   Pulse 87   Temp 99.2 F (37.3 C)   Resp 16   SpO2 99%   Visual Acuity Right Eye Distance:   Left Eye Distance:   Bilateral Distance:    Right Eye Near:   Left Eye Near:    Bilateral Near:  Physical Exam   UC Treatments / Results  Labs (all labs ordered are listed, but only abnormal results are displayed) Labs Reviewed - No data to display  EKG   Radiology No results found.  Procedures Procedures (including critical care time)  Medications Ordered in UC Medications - No data to display  Initial Impression / Assessment and Plan / UC Course  I have reviewed the triage vital signs and the nursing notes.  Pertinent labs & imaging results that were available during my care of the patient were reviewed by me and considered in my medical decision making (see chart for details).     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

## 2023-11-26 NOTE — ED Provider Notes (Signed)
 [place] EMERGENCY DEPARTMENT AT [facility] Provider Note   CSN: [id number] Arrival date & time: 11/26/23  1842     History Chief Complaint  Patient presents with   Numbness   Chest Pain    Antonio Garcia is a 23 y.o. male.  Patient past history significant for concussion, anxiety presents emergency department today with concerns of chest pain and intermittent episodes of numbness.  He reportedly seen at urgent care and advised, emergency department evaluation given concerns for possible abnormal EKG.  He reports endorsing left-sided chest pain somewhat intermittent in nature for the last 2 weeks.  Also endorsing an atypical type headache to the posterior aspect of his head started about 2 weeks ago prior to onset of his chest pain.  Denies any specific correlation with the symptoms as he does not feel that they happen to coincide with each other.  Reports at this time still having chest discomfort but no difficulty breathing.  Denies any hemoptysis, leg swelling, history of PE or DVT.  Denies any sick contacts.   Chest Pain      Home Medications Prior to Admission medications   Medication Sig Start Date End Date Taking? Authorizing Provider  doxycycline  (VIBRAMYCIN ) 100 MG capsule Take 1 capsule (100 mg total) by mouth 2 (two) times daily. 11/21/23   Stephany Ehrich, MD  valACYclovir  (VALTREX ) 1000 MG tablet TAKE 2 TABLETS BY MOUTH EVERY 12 HOURS FOR 2 DOSES AS NEEDED FOR FEVER BLISTERS 08/02/18   Breeback, Jade L, PA-C      Allergies    Patient has no known allergies.    Review of Systems   Review of Systems  Cardiovascular:  Positive for chest pain.  All other systems reviewed and are negative.   Physical Exam Updated Vital Signs BP 128/86 (BP Location: Right Arm)   Pulse 77   Temp 98.7 F (37.1 C)   Resp 16   Ht 6\' 6"  (1.981 m)   Wt 83.9 kg   SpO2 100%   BMI 21.38 kg/m  Physical Exam Vitals and nursing note reviewed.  Constitutional:      General:  He is not in acute distress.    Appearance: He is well-developed.  HENT:     Head: Normocephalic and atraumatic.  Eyes:     Conjunctiva/sclera: Conjunctivae normal.  Cardiovascular:     Rate and Rhythm: Normal rate and regular rhythm.     Heart sounds: No murmur heard. Pulmonary:     Effort: Pulmonary effort is normal. No respiratory distress.     Breath sounds: No decreased breath sounds, wheezing, rhonchi or rales.  Abdominal:     Palpations: Abdomen is soft.     Tenderness: There is no abdominal tenderness.  Musculoskeletal:        General: No swelling.     Cervical back: Neck supple.  Skin:    General: Skin is warm and dry.     Capillary Refill: Capillary refill takes less than 2 seconds.  Neurological:     Mental Status: He is alert.  Psychiatric:        Mood and Affect: Mood normal.     ED Results / Procedures / Treatments   Labs (all labs ordered are listed, but only abnormal results are displayed) Labs Reviewed  BASIC METABOLIC PANEL WITH GFR - Abnormal; Notable for the following components:      Result Value   BUN 21 (*)    All other components within normal limits  CBC  TROPONIN T, HIGH SENSITIVITY    EKG EKG Interpretation Date/Time:  Monday November 26 2023 18:47:36 EDT Ventricular Rate:  77 PR Interval:  145 QRS Duration:  107 QT Interval:  347 QTC Calculation: 393 R Axis:   -26  Text Interpretation: Sinus rhythm  Incopmplete right bundle branch block ST elev, probable normal early repol pattern Confirmed by Rosealee Concha ([id number]) on 11/26/2023 10:23:51 PM  Radiology CT Head Wo Contrast Result Date: 11/26/2023 CLINICAL DATA:  Headache EXAM: CT HEAD WITHOUT CONTRAST TECHNIQUE: Contiguous axial images were obtained from the base of the skull through the vertex without intravenous contrast. RADIATION DOSE REDUCTION: This exam was performed according to the departmental dose-optimization program which includes automated exposure control, adjustment of the mA  and/or kV according to patient size and/or use of iterative reconstruction technique. COMPARISON:  None Available. FINDINGS: Brain: No evidence of acute infarction, hemorrhage, hydrocephalus, extra-axial collection or mass lesion/mass effect. Vascular: No hyperdense vessel or unexpected calcification. Skull: Normal. Negative for fracture or focal lesion. Sinuses/Orbits: The visualized paranasal sinuses are essentially clear. The mastoid air cells are unopacified. Other: None. IMPRESSION: Normal head CT. Electronically Signed   By: Zadie Herter M.D.   On: 11/26/2023 22:50   DG Chest 2 View Result Date: 11/26/2023 CLINICAL DATA:  Chest pain EXAM: CHEST - 2 VIEW COMPARISON:  Chest x-ray 03/25/2021 FINDINGS: The heart size and mediastinal contours are within normal limits. Both lungs are clear. The visualized skeletal structures are unremarkable. IMPRESSION: No active cardiopulmonary disease. Electronically Signed   By: Tyron Gallon M.D.   On: 11/26/2023 19:43    Procedures Procedures    Medications Ordered in ED Medications - No data to display  ED Course/ Medical Decision Making/ A&P                                 Medical Decision Making Amount and/or Complexity of Data Reviewed Labs: ordered. Radiology: ordered.   This patient presents to the ED for concern of chest pain, headache. Differential diagnosis includes ACS, PE, pneumonia, bronchitis, migraine   Lab Tests:  I Ordered, and personally interpreted labs.  The pertinent results include: CBC unremarkable, BMP unremarkable, troponin unremarkable at less than 15   Imaging Studies ordered:  I ordered imaging studies including CT head I independently visualized and interpreted imaging which showed unremarkable I agree with the radiologist interpretation   Problem List / ED Course:  Patient presents to the emergency department today with concerns of chest pain and numbness.  Reports that symptoms initially began about 1  week ago without significant improvement.  Seen urgent care and advised to come the emergency department for evaluation.  No prior history of similar symptoms.  No history of any cardiac abnormalities as a child. On exam, no appreciable heart murmur.  Vitals are unremarkable.  No appreciable chest wall tenderness. Based on symptoms, cardiac evaluation ordered.  EKG shows no significant abnormalities at this time.  Added on CT head for assessment of head discomfort.  Likely benign in nature but patient and mother are somewhat apprehensive about current workup. CT head is negative.  I am unsure exactly the source of patient's symptoms.  I do believe these are all likely benign in nature however advised that patient should follow-up with his primary care provider for further evaluation and return to the emergency department for any new concerns or new or worsening symptoms.  Patient and mother agreeable with  current treatment plan verbalized understanding return precautions.  Discharged home in stable condition.  Final Clinical Impression(s) / ED Diagnoses Final diagnoses:  Atypical chest pain  Occipital headache    Rx / DC Orders ED Discharge Orders     None         Concetta Dee, PA-C 11/26/23 2349    Rosealee Concha, MD 11/27/23 [phone]

## 2023-11-26 NOTE — ED Triage Notes (Signed)
 Set from urgent care for abnormal EKG.  Onset of one week of numbness to left upper back and left arm.  At time left side chest tightness.

## 2024-03-12 ENCOUNTER — Encounter (HOSPITAL_BASED_OUTPATIENT_CLINIC_OR_DEPARTMENT_OTHER): Payer: Self-pay

## 2024-03-12 ENCOUNTER — Emergency Department (HOSPITAL_BASED_OUTPATIENT_CLINIC_OR_DEPARTMENT_OTHER): Payer: Self-pay | Admitting: Radiology

## 2024-03-12 ENCOUNTER — Emergency Department (HOSPITAL_BASED_OUTPATIENT_CLINIC_OR_DEPARTMENT_OTHER)
Admission: EM | Admit: 2024-03-12 | Discharge: 2024-03-12 | Disposition: A | Discharge status: Home / Self Care | Payer: Self-pay | Attending: Emergency Medicine | Admitting: Emergency Medicine

## 2024-03-12 ENCOUNTER — Other Ambulatory Visit: Payer: Self-pay

## 2024-03-12 DIAGNOSIS — R002 Palpitations: Secondary | ICD-10-CM | POA: Diagnosis present

## 2024-03-12 DIAGNOSIS — M62830 Muscle spasm of back: Secondary | ICD-10-CM | POA: Diagnosis not present

## 2024-03-12 LAB — CBC
HCT: 43 % (ref 39.0–52.0)
Hemoglobin: 15.2 g/dL (ref 13.0–17.0)
MCH: 30.1 pg (ref 26.0–34.0)
MCHC: 35.3 g/dL (ref 30.0–36.0)
MCV: 85.1 fL (ref 80.0–100.0)
Platelets: 213 K/uL (ref 150–400)
RBC: 5.05 MIL/uL (ref 4.22–5.81)
RDW: 11.9 % (ref 11.5–15.5)
WBC: 6.5 K/uL (ref 4.0–10.5)
nRBC: 0 % (ref 0.0–0.2)

## 2024-03-12 LAB — BASIC METABOLIC PANEL WITH GFR
Anion gap: 13 (ref 5–15)
BUN: 17 mg/dL (ref 6–20)
CO2: 26 mmol/L (ref 22–32)
Calcium: 9.9 mg/dL (ref 8.9–10.3)
Chloride: 102 mmol/L (ref 98–111)
Creatinine, Ser: 0.99 mg/dL (ref 0.61–1.24)
GFR, Estimated: >60 mL/min (ref >60)
Glucose, Bld: 116 mg/dL — ABNORMAL HIGH (ref 70–99)
Potassium: 3.4 mmol/L — ABNORMAL LOW (ref 3.5–5.1)
Sodium: 141 mmol/L (ref 135–145)

## 2024-03-12 LAB — TROPONIN T, HIGH SENSITIVITY: Troponin T High Sensitivity: <15 ng/L (ref 0–19)

## 2024-03-12 NOTE — ED Triage Notes (Signed)
 Pt states he has recent had a lot going on with chest pain, was Dx with an incomplete right bundle branch block. He states he has had this constant chest pain, with palpitations. He has had some axiety with it and has been seeing doctors for it with no definitive answers.

## 2024-03-12 NOTE — ED Provider Notes (Signed)
 [facility] EMERGENCY DEPARTMENT AT [facility] Provider Note  CSN: [id number] Arrival date & time: 03/12/24 0104  Chief Complaint(s) Palpitations  HPI Antonio Garcia is a 23 y.o. male with a past medical history listed below here for brief episode of irregular palpitations around 7 PM this afternoon.  Patient reports that he was at work moving heavy toolbox.  When he bent down he felt his heart race irregularly.  This lasted for a few minutes and resolved spontaneously.  He denied any associated chest pain or shortness of breath at the time.  He did feel lightheaded.  He denied any recent fevers or infections.  No coughing or congestion.  No nausea or vomiting.  He did report several loose bowel movements a few days ago.  No abdominal pain.  Patient endorsed several months intermittent left periscapular pain worse with movement and range of motion.  Also endorsing left-sided chest discomfort.  The history is provided by the patient.    Past Medical History Past Medical History:  Diagnosis Date   Concussion without loss of consciousness 06/2016   Recurrent herpes labialis    Patient Active Problem List   Diagnosis Date Noted   Anxiety state 11/15/2015   Obsessional thoughts 11/15/2015   Home Medication(s) Prior to Admission medications   Medication Sig Start Date End Date Taking? Authorizing Provider  doxycycline  (VIBRAMYCIN ) 100 MG capsule Take 1 capsule (100 mg total) by mouth 2 (two) times daily. 11/21/23   Maranda Jamee Jacob, MD  valACYclovir  (VALTREX ) 1000 MG tablet TAKE 2 TABLETS BY MOUTH EVERY 12 HOURS FOR 2 DOSES AS NEEDED FOR FEVER BLISTERS 08/02/18   Breeback, Jade L, PA-C                                                                                                                                    Allergies Patient has no known allergies.  Review of Systems Review of Systems As noted in HPI  Physical Exam Vital Signs  I have reviewed the triage vital  signs BP 116/74   Pulse 77   Temp 98.9 F (37.2 C)   Resp 18   Wt 78 kg   SpO2 99%   BMI 19.87 kg/m   Physical Exam Vitals reviewed.  Constitutional:      General: He is not in acute distress.    Appearance: He is well-developed. He is not diaphoretic.  HENT:     Head: Normocephalic and atraumatic.     Nose: Nose normal.  Eyes:     General: No scleral icterus.       Right eye: No discharge.        Left eye: No discharge.     Conjunctiva/sclera: Conjunctivae normal.     Pupils: Pupils are equal, round, and reactive to light.  Cardiovascular:     Rate and Rhythm: Normal rate and regular rhythm.     Heart sounds: No murmur heard.  No friction rub. No gallop.  Pulmonary:     Effort: Pulmonary effort is normal. No respiratory distress.     Breath sounds: Normal breath sounds. No stridor. No rales.  Abdominal:     General: There is no distension.     Palpations: Abdomen is soft.     Tenderness: There is no abdominal tenderness.  Musculoskeletal:        General: No tenderness.     Cervical back: Normal range of motion and neck supple.     Thoracic back: Spasms present.       Back:  Skin:    General: Skin is warm and dry.     Findings: No erythema or rash.  Neurological:     Mental Status: He is alert and oriented to person, place, and time.     ED Results and Treatments Labs (all labs ordered are listed, but only abnormal results are displayed) Labs Reviewed  BASIC METABOLIC PANEL WITH GFR - Abnormal; Notable for the following components:      Result Value   Potassium 3.4 (*)    Glucose, Bld 116 (*)    All other components within normal limits  CBC  TROPONIN T, HIGH SENSITIVITY                                                                                                                         EKG  EKG Interpretation Date/Time:  Wednesday March 12 2024 01:13:14 EDT Ventricular Rate:  103 PR Interval:  152 QRS Duration:  114 QT Interval:  326 QTC  Calculation: 427 R Axis:   -15  Text Interpretation: Sinus tachycardia Biatrial enlargement Incomplete right bundle branch block Nonspecific ST and T wave abnormality Abnormal ECG When compared with ECG of 26-Nov-2023 18:47, PREVIOUS ECG IS PRESENT Rate faster Otherwise no significant change Confirmed by Trine Likes [phone]) on 03/12/2024 1:15:18 AM       Radiology DG Chest 2 View Result Date: 03/12/2024 CLINICAL DATA:  Chest pain EXAM: CHEST - 2 VIEW COMPARISON:  11/26/2023 FINDINGS: The heart size and mediastinal contours are within normal limits. Both lungs are clear. The visualized skeletal structures are unremarkable. IMPRESSION: No active cardiopulmonary disease. Electronically Signed   By: Oneil Devonshire M.D.   On: 03/12/2024 01:27    Medications Ordered in ED Medications - No data to display Procedures Procedures  (including critical care time) Medical Decision Making / ED Course   Medical Decision Making Amount and/or Complexity of Data Reviewed Labs: ordered. Radiology: ordered.    Palpitations and chest pain differential diagnosis considered  EKG without acute ischemic changes, dysrhythmias or acute blocks.  No right bundle branch block.  Sinus tachycardia noted on EKG.  No my evaluation, his heart rate was 78.  Troponin negative.  Chest pain is highly atypical for ACS and given timeframe, feel this is sufficient to rule out.  Chest x-ray without evidence of pneumonia, pneumothorax, pulmonary edema pleural effusion.  No mediastinal abnormality.  Not  classic for a neck dissection or esophageal perforation.  CBC without leukocytosis or anemia. Metabolic panel with mild hypokalemia.  No other electrolyte derangements or renal sufficiency.  Doubt pulmonary embolism and patient is PERC negative.  Recent echo this year showed normal heart structure without valvular disease.      Final Clinical Impression(s) / ED Diagnoses Final diagnoses:  Palpitations   The patient  appears reasonably screened and/or stabilized for discharge and I doubt any other medical condition or other [facility] requiring further screening, evaluation, or treatment in the ED at this time. I have discussed the findings, Dx and Tx plan with the patient/family who expressed understanding and agree(s) with the plan. Discharge instructions discussed at length. The patient/family was given strict return precautions who verbalized understanding of the instructions. No further questions at time of discharge.  Disposition: Discharge  Condition: Good  ED Discharge Orders     None         Follow Up: Primary care provider  Go today as scheduled    This chart was dictated using voice recognition software.  Despite best efforts to proofread,  errors can occur which can change the documentation meaning.    Trine Raynell Moder, MD 03/12/24 [phone]

## 2024-03-12 NOTE — Discharge Instructions (Signed)

## 2024-03-12 NOTE — ED Notes (Signed)
 Pt states this has been going on for months, has seen a cardiologist and had an echo done in August @ [facility]
